# Patient Record
Sex: Female | Born: 1985 | Race: White | Hispanic: No | Marital: Married | Smoking: Never smoker
Health system: Southern US, Community
[De-identification: ages and names within clinical notes are randomized; demographics above are authoritative.]

## PROBLEM LIST (undated history)

## (undated) ENCOUNTER — Inpatient Hospital Stay (HOSPITAL_COMMUNITY): Payer: Self-pay

## (undated) DIAGNOSIS — K219 Gastro-esophageal reflux disease without esophagitis: Secondary | ICD-10-CM

## (undated) DIAGNOSIS — L409 Psoriasis, unspecified: Secondary | ICD-10-CM

## (undated) DIAGNOSIS — Z6740 Type O blood, Rh positive: Secondary | ICD-10-CM

## (undated) DIAGNOSIS — Z800 Family history of malignant neoplasm of digestive organs: Secondary | ICD-10-CM

## (undated) DIAGNOSIS — K589 Irritable bowel syndrome without diarrhea: Secondary | ICD-10-CM

## (undated) DIAGNOSIS — E559 Vitamin D deficiency, unspecified: Secondary | ICD-10-CM

## (undated) DIAGNOSIS — E66812 Obesity, class 2: Secondary | ICD-10-CM

## (undated) DIAGNOSIS — J45909 Unspecified asthma, uncomplicated: Secondary | ICD-10-CM

## (undated) DIAGNOSIS — M79606 Pain in leg, unspecified: Secondary | ICD-10-CM

## (undated) DIAGNOSIS — I10 Essential (primary) hypertension: Secondary | ICD-10-CM

## (undated) DIAGNOSIS — N390 Urinary tract infection, site not specified: Secondary | ICD-10-CM

## (undated) DIAGNOSIS — E669 Obesity, unspecified: Secondary | ICD-10-CM

## (undated) HISTORY — DX: Psoriasis, unspecified: L40.9

## (undated) HISTORY — DX: Type O blood, Rh positive: Z67.40

## (undated) HISTORY — DX: Gastro-esophageal reflux disease without esophagitis: K21.9

## (undated) HISTORY — PX: COLONOSCOPY: SHX174

## (undated) HISTORY — DX: Obesity, unspecified: E66.9

## (undated) HISTORY — PX: NO PAST SURGERIES: SHX2092

## (undated) HISTORY — DX: Essential (primary) hypertension: I10

## (undated) HISTORY — DX: Vitamin D deficiency, unspecified: E55.9

## (undated) HISTORY — PX: ESOPHAGOGASTRODUODENOSCOPY: SHX1529

## (undated) HISTORY — DX: Obesity, class 2: E66.812

## (undated) HISTORY — DX: Family history of malignant neoplasm of digestive organs: Z80.0

## (undated) HISTORY — DX: Irritable bowel syndrome without diarrhea: K58.9

## (undated) HISTORY — DX: Urinary tract infection, site not specified: N39.0

## (undated) HISTORY — DX: Pain in leg, unspecified: M79.606

---

## 2010-01-21 LAB — HM PAP SMEAR

## 2010-03-29 ENCOUNTER — Encounter: Payer: Self-pay | Admitting: Cardiology

## 2010-03-29 ENCOUNTER — Ambulatory Visit (HOSPITAL_COMMUNITY): Payer: BC Managed Care – PPO | Attending: Family Medicine

## 2010-03-29 DIAGNOSIS — R0989 Other specified symptoms and signs involving the circulatory and respiratory systems: Secondary | ICD-10-CM | POA: Insufficient documentation

## 2010-03-29 DIAGNOSIS — Z8249 Family history of ischemic heart disease and other diseases of the circulatory system: Secondary | ICD-10-CM | POA: Insufficient documentation

## 2010-03-29 DIAGNOSIS — R072 Precordial pain: Secondary | ICD-10-CM

## 2010-03-29 DIAGNOSIS — R0609 Other forms of dyspnea: Secondary | ICD-10-CM | POA: Insufficient documentation

## 2010-03-29 DIAGNOSIS — R002 Palpitations: Secondary | ICD-10-CM | POA: Insufficient documentation

## 2010-03-29 DIAGNOSIS — I079 Rheumatic tricuspid valve disease, unspecified: Secondary | ICD-10-CM | POA: Insufficient documentation

## 2010-03-30 ENCOUNTER — Encounter: Payer: Self-pay | Admitting: Cardiology

## 2010-04-23 LAB — FECAL OCCULT BLOOD, GUAIAC: Fecal Occult Blood: NEGATIVE

## 2010-05-26 ENCOUNTER — Encounter: Payer: Self-pay | Admitting: Family Medicine

## 2010-12-10 ENCOUNTER — Other Ambulatory Visit: Payer: Self-pay

## 2010-12-10 DIAGNOSIS — I872 Venous insufficiency (chronic) (peripheral): Secondary | ICD-10-CM

## 2010-12-23 ENCOUNTER — Encounter: Payer: Self-pay | Admitting: Surgery

## 2011-01-10 ENCOUNTER — Encounter: Payer: Self-pay | Admitting: Surgery

## 2011-01-10 ENCOUNTER — Ambulatory Visit (INDEPENDENT_AMBULATORY_CARE_PROVIDER_SITE_OTHER): Payer: BC Managed Care – PPO | Admitting: Surgery

## 2011-01-10 ENCOUNTER — Other Ambulatory Visit (INDEPENDENT_AMBULATORY_CARE_PROVIDER_SITE_OTHER): Payer: BC Managed Care – PPO | Admitting: *Deleted

## 2011-01-10 VITALS — BP 112/75 | HR 72 | Resp 14 | Ht 61.0 in | Wt 174.0 lb

## 2011-01-10 DIAGNOSIS — M79609 Pain in unspecified limb: Secondary | ICD-10-CM

## 2011-01-10 NOTE — Progress Notes (Signed)
Vascular and Vein Specialist of [facility]   Patient name: Leslie Beck MRN: [id number] DOB: 1985-02-24 Sex: female   Referred by: Rudi Heap, M.D.   Reason for referral:  Chief Complaint  Patient presents with  . New Evaluation    Pt c/o redness in BLE since September. Pt has Psorasis and has seen a dermatologist. She has worn compression hose, no help just irritated her skin more.    HISTORY OF PRESENT ILLNESS: This is a new patient consultation for evaluation of leg swelling and tingling. The patient first began having symptoms in July/August. While she was at the beach she developed swelling and pain in both of her legs. This was associated with tingling and numbness of her knee down to her feet. She took ibuprofen to help with his symptoms. They lasted for approximately 2 weeks. Since then she still has episodes of her legs turning her bright red with some amount of swelling as well as numbness. The aggravating by being in warm environment or with exercise. Cooling down helps her symptoms. She does not report that she has episodes of her feet turning blue or white. This process is not painful. She has been seen by a dermatologist who did not feel this was a skin condition. She does have a history of psoriasis on her feet. She has tried to wear compression stockings however this irritated her.     Past Medical History  Diagnosis Date  . ALLERGIC RHINITIS   . Leg pain   . Psoriasis     History reviewed. No pertinent past surgical history.  History   Social History  . Marital Status: Married    Spouse Name: N/A    Number of Children: N/A  . Years of Education: N/A   Occupational History  . Not on file.   Social History Main Topics  . Smoking status: Never Smoker   . Smokeless tobacco: Never Used  . Alcohol Use: No  . Drug Use: No  . Sexually Active: Yes    Birth Control/ Protection: Pill   Other Topics Concern  . Not on file   Social History Narrative  . No  narrative on file    Family History  Problem Relation Age of Onset  . Hypertension Mother   . Hypothyroidism Mother   . Other Mother     varicose veins  . Hypertension Father     Allergies as of 01/10/2011 - Review Complete 01/10/2011  Allergen Reaction Noted  . Zithromax (azithromycin) Rash 05/26/2010    Current Outpatient Prescriptions on File Prior to Visit  Medication Sig Dispense Refill  . Clobetasol Propionate (TEMOVATE) 0.05 % external spray Apply 1 spray topically 2 (two) times daily.        Lorita Officer Triphasic (TRI-SPRINTEC PO) Take 1 tablet by mouth daily.        Marland Kitchen omeprazole (PRILOSEC) 40 MG capsule Take 40 mg by mouth daily.           REVIEW OF SYSTEMS: Cardiovascular: No chest pain, chest pressure, palpitations, orthopnea, or dyspnea on exertion. No claudication or rest pain,  No history of DVT or phlebitis. Pulmonary: No productive cough, asthma or wheezing. Neurologic: No weakness, paresthesias, aphasia, or amaurosis. No dizziness. Hematologic: No bleeding problems or clotting disorders. Musculoskeletal: No joint pain or joint swelling. Gastrointestinal: No blood in stool or hematemesis Genitourinary: No dysuria or hematuria. Psychiatric:: No history of major depression. Integumentary: No rashes or ulcers. Constitutional: No fever or chills.  PHYSICAL EXAMINATION: General:  The patient appears their stated age.  Vital signs are BP 112/75  Pulse 72  Resp 14  Ht 5\' 1"  (1.549 m)  Wt 174 lb (78.926 kg)  BMI 32.88 kg/m2  SpO2 100% HEENT:  No gross abnormalities Pulmonary: Respirations are non-labored Musculoskeletal: There are no major deformities.   Neurologic: No focal weakness or paresthesias are detected, Skin: There are no ulcer or rashes noted. Some red areas on her calf is soft. There is a purplish streaking artifact on the left foot which is very faint Psychiatric: The patient has normal affect. Cardiovascular: Palpable dorsalis pedis  pulse bilaterally  Diagnostic Studies: Venous duplex was performed today this shows no evidence of greater saphenous or small saphenous reflux. There is reflux in both common femoral veins.   Medication Changes: None  Assessment:  Leg swelling and numbness/tingling Plan: Her duplex ultrasound shows no evidence of superficial venous reflux. There is reflux in the common femoral vein however this has not transmitted distally. In addition she has palpable pulses. With these findings I reiterated to the patient that I do not feel that her symptoms are of a vascular etiology. This sounds to me to be more neuropathic in origin. This does not sound like a classic Raynaud's phenomenon however with her history of psoriasis I certainly feel that this could be a autoimmune type process. She may benefit from a rheumatologic consult. Again I do not think that there is a vascular component to her disease process     V. Charlena Cross, M.D. [facility] [place] Office: [phone] Pager:  [phone]

## 2011-01-11 ENCOUNTER — Encounter: Payer: Self-pay | Admitting: Family Medicine

## 2011-01-31 NOTE — Procedures (Unsigned)
LOWER EXTREMITY VENOUS REFLUX EXAM  INDICATION:  Tingling in legs and purplish reddish color changes when hot  EXAM:  Using color-flow imaging and pulse Doppler spectral analysis, the right and left common femoral, femoral, popliteal, posterior tibial, great and small saphenous veins were evaluated.  There is evidence suggesting deep venous insufficiency in the right and left lower extremity at the level of the common femoral vein.  The right and left saphenofemoral junction is competent.  The right and left GSV is competent.   The right and left proximal small saphenous vein demonstrates competency.  GSV Diameter (used if found to be incompetent only)                                           Right    Left Proximal Greater Saphenous Vein           cm       cm Proximal-to-mid-thigh                     cm       cm Mid thigh                                 cm       cm Mid-distal thigh                          cm       cm Distal thigh                              cm       cm Knee                                      cm       cm  IMPRESSION: 1. The right and left great saphenous vein is competent. 2. The right and left great saphenous vein is not tortuous. 3. The deep venous system is not competent with reflux of >500     milliseconds at the level of the common femoral vein. 4. The right and left small saphenous vein is competent.  ___________________________________________ V. Charlena Cross, MD  EM/MEDQ  D:  01/11/2011  T:  01/11/2011  Job:  [id number]

## 2011-07-28 ENCOUNTER — Ambulatory Visit (INDEPENDENT_AMBULATORY_CARE_PROVIDER_SITE_OTHER): Payer: BC Managed Care – PPO | Admitting: Family Medicine

## 2011-07-28 ENCOUNTER — Ambulatory Visit (HOSPITAL_BASED_OUTPATIENT_CLINIC_OR_DEPARTMENT_OTHER)
Admission: RE | Admit: 2011-07-28 | Discharge: 2011-07-28 | Disposition: A | Discharge status: Home / Self Care | Payer: BC Managed Care – PPO | Source: Ambulatory Visit | Attending: Family Medicine | Admitting: Family Medicine

## 2011-07-28 ENCOUNTER — Encounter: Payer: Self-pay | Admitting: Family Medicine

## 2011-07-28 VITALS — BP 127/89 | HR 90 | Temp 98.4°F | Ht 62.0 in | Wt 175.0 lb

## 2011-07-28 DIAGNOSIS — R599 Enlarged lymph nodes, unspecified: Secondary | ICD-10-CM | POA: Insufficient documentation

## 2011-07-28 DIAGNOSIS — R590 Localized enlarged lymph nodes: Secondary | ICD-10-CM

## 2011-07-28 DIAGNOSIS — L409 Psoriasis, unspecified: Secondary | ICD-10-CM

## 2011-07-28 DIAGNOSIS — Z0000 Encounter for general adult medical examination without abnormal findings: Secondary | ICD-10-CM

## 2011-07-28 DIAGNOSIS — L408 Other psoriasis: Secondary | ICD-10-CM

## 2011-07-28 LAB — CBC WITH DIFFERENTIAL/PLATELET
Basophils Relative: 0.4 % (ref 0.0–3.0)
Eosinophils Relative: 1.6 % (ref 0.0–5.0)
HCT: 45.4 % (ref 36.0–46.0)
Hemoglobin: 15.3 g/dL — ABNORMAL HIGH (ref 12.0–15.0)
Lymphocytes Relative: 30.5 % (ref 12.0–46.0)
Lymphs Abs: 3.3 10*3/uL (ref 0.7–4.0)
Monocytes Relative: 5.5 % (ref 3.0–12.0)
Neutro Abs: 6.6 10*3/uL (ref 1.4–7.7)
RBC: 4.89 Mil/uL (ref 3.87–5.11)
WBC: 10.7 10*3/uL — ABNORMAL HIGH (ref 4.5–10.5)

## 2011-07-28 LAB — COMPREHENSIVE METABOLIC PANEL
AST: 19 U/L (ref 0–37)
Albumin: 4.2 g/dL (ref 3.5–5.2)
Alkaline Phosphatase: 79 U/L (ref 39–117)
Glucose, Bld: 76 mg/dL (ref 70–99)
Potassium: 4.2 mEq/L (ref 3.5–5.1)
Sodium: 142 mEq/L (ref 135–145)
Total Bilirubin: 1.1 mg/dL (ref 0.3–1.2)
Total Protein: 7.4 g/dL (ref 6.0–8.3)

## 2011-07-28 LAB — LIPID PANEL
HDL: 75.2 mg/dL (ref >39.00)
LDL Cholesterol: 94 mg/dL (ref 0–99)
VLDL: 19.4 mg/dL (ref 0.0–40.0)

## 2011-07-28 NOTE — Assessment & Plan Note (Signed)
All topical steroids and dovonex are category C and she is not interested in trying a lower potency topical steroid at this time. Referral to [facility] ordered today.

## 2011-07-28 NOTE — Assessment & Plan Note (Signed)
Question of isolated pathologic lymph node--have ordered ultrasound of neck to be done and she is going to [facility] from the office today to get this done. CBC checked today.

## 2011-07-28 NOTE — Progress Notes (Signed)
Office Note 07/28/2011  CC:  Chief Complaint  Patient presents with  . Establish Care    ? ringworm, right foot; hx of psoriasis    HPI:  Leslie Beck is a 26 y.o. White female who is here to establish care and for rash on foot. Patient's most recent primary MD: Dr. Rudi Heap at [facility]  Dr. Henderson Cloud is her OB/GYN. Old records not reviewed prior to or during today's visit.  Has psoriasis on hands and feet, is trying to get pregnant and cannot use the clobetasol that she usually uses due to category C rating in pregnancy.  She sees a Armed forces operational officer at [facility] but says that ever since her initial dermatologist there moved she has felt like she isn't listened to and she is interested in referral to different dermatologist, particularly to discuss alternative therapies while pregnant (UV-B light therapy). 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  The rash areas itch and she has not been able to apply anything except nonmedicated emollients to them.  She asks for screening test for diabetes and cholesterol b/c of her weight.  She is fasting.  Past Medical History  Diagnosis Date  . ALLERGIC RHINITIS   . Leg pain     Bilat.  No DVT  . Psoriasis   . GERD (gastroesophageal reflux disease)     not on daily PPI anymore    History reviewed. No pertinent past surgical history.  Family History  Problem Relation Age of Onset  . Hypertension Mother   . Hypothyroidism Mother   . Other Mother     varicose veins  . Hypertension Father   . Cancer Maternal Grandfather     colon cancer  . Cancer Paternal Grandmother     colon cancer    History   Social History  . Marital Status: Married    Spouse Name: N/A    Number of Children: N/A  . Years of Education: N/A   Occupational History  . Not on file.   Social History Main Topics  .  Smoking status: Never Smoker   . Smokeless tobacco: Never Used  . Alcohol Use: No  . Drug Use: No  . Sexually Active: Yes   Other Topics Concern  . 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 Henderson Cloud for GYN.    Outpatient Encounter Prescriptions as of 07/28/2011  Medication Sig Dispense Refill  . clobetasol cream (TEMOVATE) 0.05 % as needed.       Marland Kitchen OVER THE COUNTER MEDICATION FOLATE 800 MG.  DAILY      . vitamin E (VITAMIN E) 400 UNIT capsule Take 800 Units by mouth daily.      Marland Kitchen DISCONTD: Clobetasol Propionate (TEMOVATE) 0.05 % external spray Apply 1 spray topically 2 (two) times daily.        Marland Kitchen DISCONTD: Lorita Officer Triphasic (TRI-SPRINTEC PO) Take 1 tablet by mouth daily.        Marland Kitchen DISCONTD: omeprazole (PRILOSEC) 40 MG capsule Take 40 mg by mouth daily.          Allergies  Allergen Reactions  . Zithromax (Azithromycin) Rash    ROS Review of Systems  Constitutional: Negative for fever and fatigue.  HENT: Negative for congestion and sore throat.  Eyes: Negative for visual disturbance.  Respiratory: Negative for cough.   Cardiovascular: Negative for chest pain.  Gastrointestinal: Negative for nausea and abdominal pain.  Genitourinary: Negative for dysuria.  Musculoskeletal: Negative for back pain and joint swelling.  Skin: Positive for rash.  Neurological: Negative for weakness and headaches.  Hematological: Negative for adenopathy.    PE; Blood pressure 127/89, pulse 90, temperature 98.4 F (36.9 C), temperature source Temporal, height 5\' 2"  (1.575 m), weight 175 lb (79.379 kg), last menstrual period 07/13/2011, SpO2 98.00%. Gen: Alert, well appearing.  Patient is oriented to person, place, time, and situation. ENT: Ears: EACs clear, normal epithelium.  TMs with good light reflex and landmarks bilaterally.  Eyes: no injection, icteris, swelling, or  exudate.  EOMI, PERRLA. Nose: no drainage or turbinate edema/swelling.  No injection or focal lesion.  Mouth: lips without lesion/swelling.  Oral mucosa pink and moist.  Dentition intact and without obvious caries or gingival swelling.  Oropharynx without erythema, exudate, or swelling.  NECK: left submandibular region has a firm lymph node palpable--feels isolated, is moveable, is tender with firm palpation, feels approx 2 cm size.  No overlying skin changes.  No thyromegaly. CV: RRR, no m/r/g.   LUNGS: CTA bilat, nonlabored resps, good aeration in all lung fields. ABD: soft, NT, ND, BS normal.  No hepatospenomegaly or mass.  No bruits. EXT: no clubbing, cyanosis, or edema.  SKIN: deep pink patches of hyperkeratotic papular rash on medial surfaces of both feet, a few small scattered patches on each hand.  On right foot the posterior-most area of her rash has a vague circular shape with a hint of central sparing.  No flaking noted on this area, minimal flaking on other areas of the rash.  No vesicles or excoriations.     Pertinent labs:  None today  ASSESSMENT AND PLAN:   Psoriasis All topical steroids and dovonex are category C and she is not interested in trying a lower potency topical steroid at this time. Referral to [facility] ordered today.  Lymphadenopathy, submandibular Question of isolated pathologic lymph node--have ordered ultrasound of neck to be done and she is going to [facility] from the office today to get this done. CBC checked today.  Preventative health care Check CMET and fasting lipid panel today.  F/u to be determined based on results of labs and ultrasound.  Return if symptoms worsen or fail to improve.

## 2011-07-28 NOTE — Assessment & Plan Note (Signed)
Check CMET and fasting lipid panel today.

## 2011-07-28 NOTE — Addendum Note (Signed)
Addended by: Jeoffrey Massed on: 07/28/2011 01:25 PM   Modules accepted: Orders

## 2011-08-01 ENCOUNTER — Other Ambulatory Visit: Payer: Self-pay | Admitting: Radiology

## 2011-08-02 ENCOUNTER — Encounter (HOSPITAL_COMMUNITY): Payer: Self-pay | Admitting: Pharmacy Technician

## 2011-08-02 ENCOUNTER — Other Ambulatory Visit: Payer: Self-pay | Admitting: Radiology

## 2011-08-03 ENCOUNTER — Other Ambulatory Visit: Payer: Self-pay | Admitting: Family Medicine

## 2011-08-03 ENCOUNTER — Encounter (HOSPITAL_COMMUNITY): Payer: Self-pay

## 2011-08-03 ENCOUNTER — Ambulatory Visit (HOSPITAL_COMMUNITY)
Admission: RE | Admit: 2011-08-03 | Discharge: 2011-08-03 | Disposition: A | Discharge status: Home / Self Care | Payer: BC Managed Care – PPO | Source: Ambulatory Visit | Attending: Family Medicine | Admitting: Family Medicine

## 2011-08-03 DIAGNOSIS — Z538 Procedure and treatment not carried out for other reasons: Secondary | ICD-10-CM | POA: Insufficient documentation

## 2011-08-03 DIAGNOSIS — R590 Localized enlarged lymph nodes: Secondary | ICD-10-CM

## 2011-08-03 DIAGNOSIS — R599 Enlarged lymph nodes, unspecified: Secondary | ICD-10-CM | POA: Insufficient documentation

## 2011-08-03 LAB — CBC
MCHC: 35.6 g/dL (ref 30.0–36.0)
MCV: 87.1 fL (ref 78.0–100.0)
Platelets: 250 10*3/uL (ref 150–400)
RDW: 12 % (ref 11.5–15.5)
WBC: 9.5 10*3/uL (ref 4.0–10.5)

## 2011-08-03 LAB — APTT: aPTT: 29 seconds (ref 24–37)

## 2011-08-03 MED ORDER — SODIUM CHLORIDE 0.9 % IV SOLN
Freq: Once | INTRAVENOUS | Status: AC
  Administered 2011-08-03: 13:00:00 via INTRAVENOUS

## 2011-08-03 NOTE — Progress Notes (Signed)
Patient ID: Leslie Beck, female   DOB: Jun 29, 1985, 26 y.o.   MRN: [id number]  Ultrasound shows decrease in size of left neck node, now measuring 9 mm.  No indication for biopsy.  No additional enlarged nodes identified in neck by ultrasound.  Will dictate imaging report.

## 2011-08-03 NOTE — H&P (Signed)
For US guided LN biopsy.

## 2011-08-03 NOTE — H&P (Signed)
Chief Complaint: "I'm here for lymph node biopsy" Physician:McGowen HPI: Leslie Beck is an 26 y.o. female who was found to have an enlarged LN in the left side of her neck of physical exam. A subsequent US showed a 1.6 x 1.0 cm enlarged LN. She has been referred for US guided biopsy. She denies associated illnesses, fever, wt change. Only recent infection was yeast infection. Since it was found on PE and [place], she feels it is smaller.   Past Medical History:  Past Medical History  Diagnosis Date  . ALLERGIC RHINITIS   . Leg pain     Bilat.  No DVT  . Psoriasis   . GERD (gastroesophageal reflux disease)     not on daily PPI anymore    Past Surgical History: No past surgical history on file.  Family History:  Family History  Problem Relation Age of Onset  . Hypertension Mother   . Hypothyroidism Mother   . Other Mother     varicose veins  . Hypertension Father   . Cancer Maternal Grandfather     colon cancer  . Cancer Paternal Grandmother     colon cancer    Social History:  reports that she has never smoked. She has never used smokeless tobacco. She reports that she does not drink alcohol or use illicit drugs.  Allergies:  Allergies  Allergen Reactions  . Zithromax (Azithromycin) Rash    Medications: (Not in a hospital admission)  Please HPI for pertinent positives, otherwise complete 10 system ROS negative.  Physical Exam: Blood pressure 131/83, pulse 101, temperature 97.1 F (36.2 C), temperature source Oral, resp. rate 18, height 5\' 2"  (1.575 m), weight 170 lb (77.111 kg), last menstrual period 07/13/2011, SpO2 100.00%. Body mass index is 31.09 kg/(m^2).   General Appearance:  Alert, cooperative, no distress, appears stated age  Head:  Normocephalic, without obvious abnormality, atraumatic  ENT: Unremarkable  Neck: Supple, symmetrical, trachea midline, possible very subtle (L)cervial LN, NT to palp  Lungs:   Clear to auscultation bilaterally, no w/r/r,  respirations unlabored without use of accessory muscles.  Chest Wall:  No tenderness or deformity  Heart:  Regular rate and rhythm, S1, S2 normal, no murmur, rub or gallop. Carotids 2+ without bruit.  Abdomen:   Soft, non-tender, non distended. Bowel sounds active all four quadrants,  no masses, no organomegaly.  Neurologic: Normal affect, no gross deficits.   No results found for this or any previous visit (from the past 48 hour(s)). No results found.  Assessment/Plan (L)neck lymphadenopathy Schedule for US guided biopsy. Discussed plan to look with [place] and if Lymphadenopathy resolved, may not do biopsy. Otherwise discussed risks and complications. Labs pending Consent signed in chart.  Brayton El PA-C 08/03/2011, 1:23 PM

## 2011-08-15 ENCOUNTER — Encounter: Payer: Self-pay | Admitting: Family Medicine

## 2011-08-26 ENCOUNTER — Ambulatory Visit (INDEPENDENT_AMBULATORY_CARE_PROVIDER_SITE_OTHER): Payer: BC Managed Care – PPO | Admitting: Family Medicine

## 2011-08-26 ENCOUNTER — Encounter: Payer: Self-pay | Admitting: Family Medicine

## 2011-08-26 ENCOUNTER — Other Ambulatory Visit (HOSPITAL_COMMUNITY)
Admission: RE | Admit: 2011-08-26 | Discharge: 2011-08-26 | Disposition: A | Discharge status: Home / Self Care | Payer: BC Managed Care – PPO | Source: Ambulatory Visit | Attending: Family Medicine | Admitting: Family Medicine

## 2011-08-26 VITALS — BP 129/87 | HR 105 | Temp 98.1°F | Ht 62.0 in | Wt 179.0 lb

## 2011-08-26 DIAGNOSIS — R102 Pelvic and perineal pain: Secondary | ICD-10-CM

## 2011-08-26 DIAGNOSIS — N949 Unspecified condition associated with female genital organs and menstrual cycle: Secondary | ICD-10-CM

## 2011-08-26 DIAGNOSIS — N760 Acute vaginitis: Secondary | ICD-10-CM

## 2011-08-26 DIAGNOSIS — Z01419 Encounter for gynecological examination (general) (routine) without abnormal findings: Secondary | ICD-10-CM | POA: Insufficient documentation

## 2011-08-26 DIAGNOSIS — N898 Other specified noninflammatory disorders of vagina: Secondary | ICD-10-CM

## 2011-08-26 LAB — POCT URINE PREGNANCY: Preg Test, Ur: NEGATIVE

## 2011-08-26 MED ORDER — FLUCONAZOLE 200 MG PO TABS: ORAL_TABLET | ORAL | Status: DC

## 2011-08-26 MED ORDER — METRONIDAZOLE 500 MG PO TABS: ORAL_TABLET | ORAL | Status: DC

## 2011-08-26 NOTE — Patient Instructions (Addendum)
Bacterial Vaginosis Bacterial vaginosis (BV) is a vaginal infection where the normal balance of bacteria in the vagina is disrupted. The normal balance is then replaced by an overgrowth of certain bacteria. There are several different kinds of bacteria that can cause BV. BV is the most common vaginal infection in women of childbearing age. CAUSES   The cause of BV is not fully understood. BV develops when there is an increase or imbalance of harmful bacteria.   Some activities or behaviors can upset the normal balance of bacteria in the vagina and put women at increased risk including:   Having a new sex partner or multiple sex partners.   Douching.   Using an intrauterine device (IUD) for contraception.   It is not clear what role sexual activity plays in the development of BV. However, women that have never had sexual intercourse are rarely infected with BV.  Women do not get BV from toilet seats, bedding, swimming pools or from touching objects around them.  SYMPTOMS   Grey vaginal discharge.   A fish-like odor with discharge, especially after sexual intercourse.   Itching or burning of the vagina and vulva.   Burning or pain with urination.   Some women have no signs or symptoms at all.  DIAGNOSIS  Your caregiver must examine the vagina for signs of BV. Your caregiver will perform lab tests and look at the sample of vaginal fluid through a microscope. They will look for bacteria and abnormal cells (clue cells), a pH test higher than 4.5, and a positive amine test all associated with BV.  RISKS AND COMPLICATIONS   Pelvic inflammatory disease (PID).   Infections following gynecology surgery.   Developing HIV.   Developing herpes virus.  TREATMENT  Sometimes BV will clear up without treatment. However, all women with symptoms of BV should be treated to avoid complications, especially if gynecology surgery is planned. Female partners generally do not need to be treated. However,  BV may spread between female sex partners so treatment is helpful in preventing a recurrence of BV.   BV may be treated with antibiotics. The antibiotics come in either pill or vaginal cream forms. Either can be used with nonpregnant or pregnant women, but the recommended dosages differ. These antibiotics are not harmful to the baby.   BV can recur after treatment. If this happens, a second round of antibiotics will often be prescribed.   Treatment is important for pregnant women. If not treated, BV can cause a premature delivery, especially for a pregnant woman who had a premature birth in the past. All pregnant women who have symptoms of BV should be checked and treated.   For chronic reoccurrence of BV, treatment with a type of prescribed gel vaginally twice a week is helpful.  HOME CARE INSTRUCTIONS   Finish all medication as directed by your caregiver.   Do not have sex until treatment is completed.   Tell your sexual partner that you have a vaginal infection. They should see their caregiver and be treated if they have problems, such as a mild rash or itching.   Practice safe sex. Use condoms. Only have 1 sex partner.  PREVENTION  Basic prevention steps can help reduce the risk of upsetting the natural balance of bacteria in the vagina and developing BV:  Do not have sexual intercourse (be abstinent).   Do not douche.   Use all of the medicine prescribed for treatment of BV, even if the signs and symptoms go away.     Tell your sex partner if you have BV. That way, they can be treated, if needed, to prevent reoccurrence.  SEEK MEDICAL CARE IF:   Your symptoms are not improving after 3 days of treatment.   You have increased discharge, pain, or fever.  MAKE SURE YOU:   Understand these instructions.   Will watch your condition.   Will get help right away if you are not doing well or get worse.  FOR MORE INFORMATION  Division of STD Prevention (DSTDP), Centers for Disease  Control and Prevention: www.cdc.gov/std American Social Health Association (ASHA): www.ashastd.org  Document Released: 01/31/2005 Document Revised: 01/20/2011 Document Reviewed: 07/24/2008 ExitCare Patient Information 2012 [employer]. 

## 2011-08-26 NOTE — Progress Notes (Signed)
OFFICE NOTE  08/26/2011  CC:  Chief Complaint  Patient presents with  . Acute    vaginal discharge, burning; LMP 6/29; trying to conceive     HPI: Patient is a 26 y.o. Caucasian female who is here for vaginal discharge. She describes recurrent itchy, burning vaginal region over the last 2 mo, discharge present (white, ?blood tinged today).  Has been treated repeatedly with either single dose diflucan or multidose monistat and each time her symptoms resolved for short periods and then returned.  No abx prior to onset of these sx's. Has apparently had one wet prep that was unremarkable but she was treated for yeast anyway. She is married and she is trying to get pregnant, but her frequency of intercourse has actually been almost none over the last couple of months due to her symptoms.  LMP 08/13/11. Denies urinary urgency, burning, or frequency.  Pertinent PMH:  Past Medical History  Diagnosis Date  . ALLERGIC RHINITIS   . Leg pain     Bilat.  No DVT  . Psoriasis   . GERD (gastroesophageal reflux disease)     not on daily PPI anymore  . 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 clobetasol cream (TEMOVATE) 0.05 % Apply 1 application topically as needed.       Marland Kitchen OVER THE COUNTER MEDICATION Take 1 tablet by mouth daily. FOLATE 800 MG.      . vitamin E (VITAMIN E) 400 UNIT capsule Take 800 Units by mouth daily.        PE: Blood pressure 129/87, pulse 105, temperature 98.1 F (36.7 C), temperature source Temporal, height 5\' 2"  (1.575 m), weight 179 lb (81.194 kg), last menstrual period 08/13/2011. Gen: Alert, well appearing.  Patient is oriented to person, place, time, and situation. Pelvic exam: deep pink rash symmetrically distributed around her labia majora, quite a bit of milky discharge. No blood.  Vaginal mucosa normal.  Cervix without focal lesion, os is closed.  Bimanual: no cervical motion tenderness.  LAB:  UPT today negative  IMPRESSION AND PLAN:  Vulvovaginitis Sx's and exam more suggestive of BV, but she does have a yeast-like rash on her labia majora. I did a wet prep and sent to lab, also sent GC/Chlamydia probe. Gave empiric metronidazole 500 mg bid x 7d as well as diflucan 200mg  qd x 5d. Gave educational handout on BV and reviewed it with her.     FOLLOW UP: prn

## 2011-08-28 NOTE — Assessment & Plan Note (Signed)
Sx's and exam more suggestive of BV, but she does have a yeast-like rash on her labia majora. I did a wet prep and sent to lab, also sent GC/Chlamydia probe. Gave empiric metronidazole 500 mg bid x 7d as well as diflucan 200mg  qd x 5d. Gave educational handout on BV and reviewed it with her.

## 2011-08-31 ENCOUNTER — Telehealth: Payer: Self-pay | Admitting: *Deleted

## 2011-08-31 NOTE — Telephone Encounter (Signed)
PC from patient to see if lab results are back from Friday.  Advised all results are not back, but results that have finalized are negative/normal.  She is agreeable.  She states her symptoms are better, but she is still having brown./bloody discharge.  She is finished with diflucan, and continues to take flagyl.  Advised we will call her when we have more results.

## 2011-08-31 NOTE — Telephone Encounter (Signed)
OK.  I agree.

## 2011-09-27 ENCOUNTER — Other Ambulatory Visit: Payer: Self-pay | Admitting: Obstetrics & Gynecology

## 2011-11-10 ENCOUNTER — Ambulatory Visit: Payer: BC Managed Care – PPO | Admitting: Family Medicine

## 2011-11-21 ENCOUNTER — Encounter: Payer: Self-pay | Admitting: Family Medicine

## 2011-11-21 ENCOUNTER — Ambulatory Visit (INDEPENDENT_AMBULATORY_CARE_PROVIDER_SITE_OTHER): Payer: BC Managed Care – PPO | Admitting: Family Medicine

## 2011-11-21 VITALS — BP 131/84 | HR 103 | Ht 62.0 in | Wt 180.0 lb

## 2011-11-21 DIAGNOSIS — Z3490 Encounter for supervision of normal pregnancy, unspecified, unspecified trimester: Secondary | ICD-10-CM | POA: Insufficient documentation

## 2011-11-21 DIAGNOSIS — R3915 Urgency of urination: Secondary | ICD-10-CM

## 2011-11-21 DIAGNOSIS — R350 Frequency of micturition: Secondary | ICD-10-CM

## 2011-11-21 DIAGNOSIS — N912 Amenorrhea, unspecified: Secondary | ICD-10-CM

## 2011-11-21 DIAGNOSIS — Z331 Pregnant state, incidental: Secondary | ICD-10-CM

## 2011-11-21 LAB — POCT URINALYSIS DIPSTICK
Bilirubin, UA: NEGATIVE
Ketones, UA: NEGATIVE
Protein, UA: NEGATIVE
Spec Grav, UA: 1.01

## 2011-11-21 LAB — POCT URINE PREGNANCY: Preg Test, Ur: POSITIVE

## 2011-11-21 NOTE — Assessment & Plan Note (Signed)
First trimester. Some mild cramping but no vag bleeding or d/c. Her urine looks clear but will send for c/s for completeness. Follow expectantly, start PNV, avoid NSAIDs.  She'll arrange initial appt with her OB/GYN in a couple of months or so, earlier if problems or questions. She'll return soon for flu vaccine IM.

## 2011-11-21 NOTE — Progress Notes (Signed)
OFFICE NOTE  11/21/2011  CC:  Chief Complaint  Patient presents with  . Amenorrhea    LMP 10/16/11, + UPT at home, - UPT 2 days prior     HPI: Patient is a 26 y.o. Caucasian female who is here for suspected pregnancy. Her LMP was 10/16/11.  She did a home UPT 2 days ago and it was positive. She complains of mild fatigue that is new but no nausea. She also started having mild (2/10 intensity) suprapubic cramps described as "annoying twinges".  No vag bleeding.  No vag d/c.  No dysuria.  Bowels moving fine. Menses are usually very regular. This is her first known pregnancy.   Pertinent PMH:  Past Medical History  Diagnosis Date  . ALLERGIC RHINITIS   . Leg pain     Bilat.  No DVT  . Psoriasis   . GERD (gastroesophageal reflux disease)     not on daily PPI anymore  . 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  PE: Blood pressure 131/84, pulse 103, height 5\' 2"  (1.575 m), weight 180 lb (81.647 kg), last menstrual period 10/16/2011. Gen: Alert, well appearing.  Patient is oriented to person, place, time, and situation. ENT:  Eyes: no injection, icteris, swelling, or exudate.  EOMI, PERRLA. Nose: no drainage or turbinate edema/swelling.  No injection or focal lesion.  Mouth: lips without lesion/swelling.  Oral mucosa pink and moist.  Dentition intact and without obvious caries or gingival swelling.  Oropharynx without erythema, exudate, or swelling.  Neck - No masses or thyromegaly or limitation in range of motion CV: RRR, no m/r/g.   LUNGS: CTA bilat, nonlabored resps, good aeration in all lung fields. ABD: soft, NT, ND, BS normal.  No hepatospenomegaly or mass.  No bruits. EXT: no clubbing, cyanosis, or edema.   LAB: UPT here in office today was positive.  IMPRESSION AND PLAN:  Pregnancy First trimester. Some mild cramping but no vag bleeding or d/c. Her urine looks clear but will send for c/s for completeness. Follow expectantly,  start PNV, avoid NSAIDs.  She'll arrange initial appt with her OB/GYN in a couple of months or so, earlier if problems or questions. She'll return soon for flu vaccine IM.   An After Visit Summary was printed and given to the patient.  FOLLOW UP: prn

## 2011-11-23 LAB — URINE CULTURE: Colony Count: 7000

## 2011-12-30 ENCOUNTER — Inpatient Hospital Stay (HOSPITAL_COMMUNITY)
Admission: AD | Admit: 2011-12-30 | Discharge: 2011-12-30 | Disposition: A | Discharge status: Home / Self Care | Payer: BC Managed Care – PPO | Source: Ambulatory Visit | Attending: Obstetrics and Gynecology | Admitting: Obstetrics and Gynecology

## 2011-12-30 ENCOUNTER — Encounter (HOSPITAL_COMMUNITY): Payer: Self-pay

## 2011-12-30 ENCOUNTER — Inpatient Hospital Stay (HOSPITAL_COMMUNITY): Payer: BC Managed Care – PPO

## 2011-12-30 DIAGNOSIS — O418X90 Other specified disorders of amniotic fluid and membranes, unspecified trimester, not applicable or unspecified: Secondary | ICD-10-CM

## 2011-12-30 DIAGNOSIS — O4690 Antepartum hemorrhage, unspecified, unspecified trimester: Secondary | ICD-10-CM

## 2011-12-30 DIAGNOSIS — O209 Hemorrhage in early pregnancy, unspecified: Secondary | ICD-10-CM | POA: Insufficient documentation

## 2011-12-30 DIAGNOSIS — O468X9 Other antepartum hemorrhage, unspecified trimester: Secondary | ICD-10-CM | POA: Diagnosis present

## 2011-12-30 HISTORY — DX: Unspecified asthma, uncomplicated: J45.909

## 2011-12-30 LAB — CBC
MCH: 31 pg (ref 26.0–34.0)
MCV: 89 fL (ref 78.0–100.0)
Platelets: 234 10*3/uL (ref 150–400)
RBC: 4.26 MIL/uL (ref 3.87–5.11)

## 2011-12-30 NOTE — MAU Note (Signed)
Patient was sent by [facility] for further evaluation, around 3 a.m yesterday had intermittent bleeding then turned brown, around 10:30 a.m. Brown progressing to bright red,  Has changed 1 pad, cramping on right side, had [place] yesterday was fine at [facility]

## 2011-12-30 NOTE — MAU Provider Note (Signed)
Chief Complaint: Vaginal Bleeding   First Provider Initiated Contact with Patient 12/30/11 1605     SUBJECTIVE HPI: Leslie Beck is a 26 y.o. G1P0 at [redacted]w[redacted]d by LMP who presents to maternity admissions reporting heavy vaginal bleeding today.  She was seen at [facility] yesterday with vaginal spotting and had a normal U/S.  Today, her bleeding increased to soaking 1/2 pad/hour for 2 hours but is starting to decrease now to spotting.  She reports some mild right sided pain intermittently.  She  Denies vaginal itching/burning, urinary symptoms, h/a, dizziness, n/v, or fever/chills.     Past Medical History  Diagnosis Date  . ALLERGIC RHINITIS   . Leg pain     Bilat.  No DVT  . Psoriasis   . GERD (gastroesophageal reflux disease)     not on daily PPI anymore  . Recurrent UTI 2012/13  . Asthma    Past Surgical History  Procedure Date  . No past surgeries    History   Social History  . Marital Status: Married    Spouse Name: N/A    Number of Children: N/A  . Years of Education: N/A   Occupational History  . Not on file.   Social History Main Topics  . Smoking status: Never Smoker   . Smokeless tobacco: Never Used  . Alcohol Use: No  . Drug Use: No  . Sexually Active: Yes -- Female partner(s)    Birth Control/ Protection: None   Other Topics Concern  . 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 Henderson Cloud for GYN.   No current facility-administered medications on file prior to encounter.   No current outpatient prescriptions on file prior to encounter.   Allergies  Allergen Reactions  . Zithromax (Azithromycin) Rash    ROS: Pertinent items in HPI  OBJECTIVE Blood pressure 136/84, pulse 106, temperature 98.2 F (36.8 C), temperature source Oral, resp. rate 16, height 5\' 1"  (1.549 m), weight 82.464 kg (181 lb 12.8 oz), last menstrual period  10/16/2011. GENERAL: Well-developed, well-nourished female in no acute distress.  HEENT: Normocephalic HEART: normal rate RESP: normal effort ABDOMEN: Soft, non-tender EXTREMITIES: Nontender, no edema NEURO: Alert and oriented Pelvic exam: Cervix pink, visually closed, without lesion, moderate amount dark brown blood in vagina, vaginal walls and external genitalia normal Bimanual exam: Cervix 0/long/high, firm, anterior, neg CMT, uterus nontender, ~10 week size, adnexa without tenderness, enlargement, or mass  LAB RESULTS Results for orders placed during the hospital encounter of 12/30/11 (from the past 24 hour(s))  CBC     Status: Abnormal   Collection Time   12/30/11  4:34 PM      Component Value Range   WBC 13.8 (*) 4.0 - 10.5 K/uL   RBC 4.26  3.87 - 5.11 MIL/uL   Hemoglobin 13.2  12.0 - 15.0 g/dL   HCT 16.1  09.6 - 04.5 %   MCV 89.0  78.0 - 100.0 fL   MCH 31.0  26.0 - 34.0 pg   MCHC 34.8  30.0 - 36.0 g/dL   RDW 40.9  81.1 - 91.4 %   Platelets 234  150 - 400 K/uL  ABO/RH     Status: Normal (Preliminary result)   Collection Time   12/30/11  4:34 PM      Component Value Range   ABO/RH(D) O POS      IMAGING US Ob Comp Less  14 Wks  12/30/2011  *RADIOLOGY REPORT*  Clinical Data: Pregnant, vaginal bleeding, right-sided pain  OBSTETRIC <14 WK [place] AND TRANSVAGINAL OB US  Technique:  Both transabdominal and transvaginal ultrasound examinations were performed for complete evaluation of the gestation as well as the maternal uterus, adnexal regions, and pelvic cul-de-sac.  Transvaginal technique was performed to assess early pregnancy.  Comparison:  None.  Intrauterine gestational sac:  Visualized/normal in shape. Yolk sac: Not visualized. Embryo: Present Cardiac Activity: Present Heart Rate: 162 bpm  CRL: 33.5  mm  10 w  2 d            [place] EDC: 07/25/2012  Maternal uterus/adnexae: Small subchorionic hemorrhage.  Right ovary is within normal limits, measuring 3.4 x 2.9 x 3.0 cm, and is  notable for a corpus luteal cyst.  Left ovary is within normal limits, measuring 1.5 x 1.9 x 0.9 cm.  No free fluid.  IMPRESSION: Single live intrauterine gestation with estimated gestational age [redacted] weeks 2 days by crown-rump length.   Original Report Authenticated By: Charline Bills, M.D.    US Ob Transvaginal  12/30/2011  *RADIOLOGY REPORT*  Clinical Data: Pregnant, vaginal bleeding, right-sided pain  OBSTETRIC <14 WK [place] AND TRANSVAGINAL OB US  Technique:  Both transabdominal and transvaginal ultrasound examinations were performed for complete evaluation of the gestation as well as the maternal uterus, adnexal regions, and pelvic cul-de-sac.  Transvaginal technique was performed to assess early pregnancy.  Comparison:  None.  Intrauterine gestational sac:  Visualized/normal in shape. Yolk sac: Not visualized. Embryo: Present Cardiac Activity: Present Heart Rate: 162 bpm  CRL: 33.5  mm  10 w  2 d            [place] EDC: 07/25/2012  Maternal uterus/adnexae: Small subchorionic hemorrhage.  Right ovary is within normal limits, measuring 3.4 x 2.9 x 3.0 cm, and is notable for a corpus luteal cyst.  Left ovary is within normal limits, measuring 1.5 x 1.9 x 0.9 cm.  No free fluid.  IMPRESSION: Single live intrauterine gestation with estimated gestational age [redacted] weeks 2 days by crown-rump length.   Original Report Authenticated By: Charline Bills, M.D.     ASSESSMENT 1. Vaginal bleeding in pregnancy   2. Subchorionic hemorrhage     PLAN Called Dr Claiborne Billings to discuss assessment and findings Discharge home with bleeding precautions Pelvic rest, modified bed rest for a few days F/U with prenatal provider Return to MAU as needed    Medication List     As of 12/30/2011  6:31 PM    ASK your doctor about these medications         hydrocortisone cream 1 %   Apply 1 application topically daily as needed. psoriasis      prenatal multivitamin Tabs   Take 1 tablet by mouth daily.         Sharen Counter Certified Nurse-Midwife 12/30/2011  6:31 PM

## 2012-01-17 DIAGNOSIS — Z6740 Type O blood, Rh positive: Secondary | ICD-10-CM

## 2012-01-17 HISTORY — DX: Type O blood, Rh positive: Z67.40

## 2012-01-18 LAB — OB RESULTS CONSOLE GC/CHLAMYDIA: Chlamydia: NEGATIVE

## 2012-01-18 LAB — OB RESULTS CONSOLE ABO/RH: RH Type: POSITIVE

## 2012-01-18 LAB — OB RESULTS CONSOLE RPR: RPR: NONREACTIVE

## 2012-02-14 ENCOUNTER — Ambulatory Visit (INDEPENDENT_AMBULATORY_CARE_PROVIDER_SITE_OTHER): Payer: BC Managed Care – PPO | Admitting: *Deleted

## 2012-02-14 DIAGNOSIS — Z23 Encounter for immunization: Secondary | ICD-10-CM

## 2012-02-15 NOTE — L&D Delivery Note (Signed)
Patient was C/C/+2 and pushed for 50 minutes with epidural.   NSVD  female infant, Apgars 9,9, weight P.   The patient had one midline episiotomy repaired with 2-0 vicryl R. Fundus was firm. EBL was expected but methergine given for some atony that responded to vigorous massage. Placenta was delivered intact. Vagina was clear.  Baby was vigorous to bedside.  Leslie Beck

## 2012-05-08 ENCOUNTER — Other Ambulatory Visit: Payer: Self-pay | Admitting: Obstetrics and Gynecology

## 2012-06-27 LAB — OB RESULTS CONSOLE GBS: GBS: NEGATIVE

## 2012-07-14 ENCOUNTER — Inpatient Hospital Stay (HOSPITAL_COMMUNITY)
Admission: AD | Admit: 2012-07-14 | Discharge: 2012-07-14 | Disposition: A | Discharge status: Home / Self Care | Payer: BC Managed Care – PPO | Source: Ambulatory Visit | Attending: Obstetrics and Gynecology | Admitting: Obstetrics and Gynecology

## 2012-07-14 ENCOUNTER — Encounter (HOSPITAL_COMMUNITY): Payer: Self-pay | Admitting: Family

## 2012-07-14 DIAGNOSIS — O133 Gestational [pregnancy-induced] hypertension without significant proteinuria, third trimester: Secondary | ICD-10-CM

## 2012-07-14 DIAGNOSIS — O139 Gestational [pregnancy-induced] hypertension without significant proteinuria, unspecified trimester: Secondary | ICD-10-CM

## 2012-07-14 LAB — COMPREHENSIVE METABOLIC PANEL
CO2: 21 mEq/L (ref 19–32)
Calcium: 9.3 mg/dL (ref 8.4–10.5)
Creatinine, Ser: 0.65 mg/dL (ref 0.50–1.10)
GFR calc Af Amer: >90 mL/min (ref >90)
GFR calc non Af Amer: >90 mL/min (ref >90)
Glucose, Bld: 94 mg/dL (ref 70–99)
Total Bilirubin: 0.8 mg/dL (ref 0.3–1.2)

## 2012-07-14 LAB — CBC
HCT: 38.5 % (ref 36.0–46.0)
Hemoglobin: 13 g/dL (ref 12.0–15.0)
MCH: 30.7 pg (ref 26.0–34.0)
MCV: 91 fL (ref 78.0–100.0)
RBC: 4.23 MIL/uL (ref 3.87–5.11)

## 2012-07-14 LAB — URINALYSIS, ROUTINE W REFLEX MICROSCOPIC
Bilirubin Urine: NEGATIVE
Glucose, UA: NEGATIVE mg/dL
Hgb urine dipstick: NEGATIVE
Specific Gravity, Urine: 1.01 (ref 1.005–1.030)
pH: 6.5 (ref 5.0–8.0)

## 2012-07-14 NOTE — MAU Note (Signed)
Patient presents to MAU with c/o headache since this morning. Reports her BP has increased over last few weeks and was told to come in for eval if h/a or edema increased. Reports last office visit on Wednesday.  Denies vaginal bleeding, LOF or cramping. Denies blurred vision, RUQ pain, or any other lateralizing s/s.

## 2012-07-14 NOTE — MAU Note (Signed)
Pt reports bp was 145/94 at home starting to have  Headache. Good fetal movement reported. On call MD told her to come in. Denies any ctx.

## 2012-07-14 NOTE — MAU Provider Note (Signed)
History     CSN: [id number]  Arrival date and time: 07/14/12 1257   First Provider Initiated Contact with Patient 07/14/12 1355      Chief Complaint  Patient presents with  . Hypertension   HPI This is a 27 y.o. female at [redacted]w[redacted]d who presents with c/o elevated BP at home and headache. Has had some increasing blood pressures recently and was told to come in if she had symptoms. Was 140/95 at home.   RN Note: Patient presents to MAU with c/o headache since this morning. Reports her BP has increased over last few weeks and was told to come in for eval if h/a or edema increased. Reports last office visit on Wednesday.  Denies vaginal bleeding, LOF or cramping. Denies blurred vision, RUQ pain, or any other lateralizing s/s.       OB History   Grav Para Term Preterm Abortions TAB SAB Ect Mult Living   1               Past Medical History  Diagnosis Date  . ALLERGIC RHINITIS   . Leg pain     Bilat.  No DVT  . Psoriasis   . GERD (gastroesophageal reflux disease)     not on daily PPI anymore  . Recurrent UTI 2012/13  . Asthma     Past Surgical History  Procedure Laterality Date  . No past surgeries      Family History  Problem Relation Age of Onset  . Hypertension Mother   . Hypothyroidism Mother   . Other Mother     varicose veins  . Hypertension Father   . Cancer Maternal Grandfather     colon cancer  . Cancer Paternal Grandmother     colon cancer    History  Substance Use Topics  . Smoking status: Never Smoker   . Smokeless tobacco: Never Used  . Alcohol Use: No    Allergies:  Allergies  Allergen Reactions  . Zithromax (Azithromycin) Rash    Prescriptions prior to admission  Medication Sig Dispense Refill  . clobetasol ointment (TEMOVATE) 0.05 % Apply 1 application topically at bedtime as needed (psoriasis).      . Prenatal Vit-Fe Fumarate-FA (PRENATAL MULTIVITAMIN) TABS Take 1 tablet by mouth daily.        Review of Systems  Constitutional:  Negative for fever, chills and malaise/fatigue.  Cardiovascular: Positive for leg swelling.  Gastrointestinal: Negative for nausea, vomiting, abdominal pain, diarrhea and constipation.  Genitourinary: Negative for dysuria.  Neurological: Positive for headaches. Negative for dizziness.   Physical Exam   Blood pressure 130/79, pulse 97, temperature 98.2 F (36.8 C), temperature source Oral, resp. rate 18, height 5\' 1"  (1.549 m), weight 96.979 kg (213 lb 12.8 oz), last menstrual period 10/16/2011. Filed Vitals:   07/14/12 1335 07/14/12 1347 07/14/12 1402 07/14/12 1417  BP: 130/79 134/82 132/85 128/79  Pulse: 97 87 81 81  Temp:      TempSrc:      Resp:      Height:      Weight:        Physical Exam  Constitutional: She is oriented to person, place, and time. She appears well-developed and well-nourished. No distress.  HENT:  Head: Normocephalic.  Neck: Normal range of motion. Neck supple.  Cardiovascular: Normal rate.   Respiratory: Effort normal.  GI: Soft. She exhibits no distension. There is no tenderness. There is no rebound and no guarding.  Musculoskeletal: Normal range of motion. She exhibits edema (  1+ lower extremities).  Neurological: She is alert and oriented to person, place, and time. She has normal reflexes. She displays normal reflexes (No clonus).  Skin: Skin is warm and dry.  Psychiatric: She has a normal mood and affect.   FHR reactive  Irregular contractions  MAU Course  Procedures  MDM Labs ordered: Results for orders placed during the hospital encounter of 07/14/12 (from the past 24 hour(s))  URINALYSIS, ROUTINE W REFLEX MICROSCOPIC     Status: Abnormal   Collection Time    07/14/12  1:10 PM      Result Value Range   Color, Urine YELLOW  YELLOW   APPearance CLEAR  CLEAR   Specific Gravity, Urine 1.010  1.005 - 1.030   pH 6.5  5.0 - 8.0   Glucose, UA NEGATIVE  NEGATIVE mg/dL   Hgb urine dipstick NEGATIVE  NEGATIVE   Bilirubin Urine NEGATIVE   NEGATIVE   Ketones, ur 15 (*) NEGATIVE mg/dL   Protein, ur NEGATIVE  NEGATIVE mg/dL   Urobilinogen, UA 0.2  0.0 - 1.0 mg/dL   Nitrite NEGATIVE  NEGATIVE   Leukocytes, UA NEGATIVE  NEGATIVE  CBC     Status: Abnormal   Collection Time    07/14/12  2:29 PM      Result Value Range   WBC 14.8 (*) 4.0 - 10.5 K/uL   RBC 4.23  3.87 - 5.11 MIL/uL   Hemoglobin 13.0  12.0 - 15.0 g/dL   HCT 16.1  09.6 - 04.5 %   MCV 91.0  78.0 - 100.0 fL   MCH 30.7  26.0 - 34.0 pg   MCHC 33.8  30.0 - 36.0 g/dL   RDW 40.9  81.1 - 91.4 %   Platelets 189  150 - 400 K/uL  COMPREHENSIVE METABOLIC PANEL     Status: Abnormal   Collection Time    07/14/12  2:29 PM      Result Value Range   Sodium 137  135 - 145 mEq/L   Potassium 3.7  3.5 - 5.1 mEq/L   Chloride 103  96 - 112 mEq/L   CO2 21  19 - 32 mEq/L   Glucose, Bld 94  70 - 99 mg/dL   BUN 7  6 - 23 mg/dL   Creatinine, Ser 7.82  0.50 - 1.10 mg/dL   Calcium 9.3  8.4 - 95.6 mg/dL   Total Protein 5.7 (*) 6.0 - 8.3 g/dL   Albumin 2.5 (*) 3.5 - 5.2 g/dL   AST 14  0 - 37 U/L   ALT 12  0 - 35 U/L   Alkaline Phosphatase 166 (*) 39 - 117 U/L   Total Bilirubin 0.8  0.3 - 1.2 mg/dL   GFR calc non Af Amer >90  >90 mL/min   GFR calc Af Amer >90  >90 mL/min     Assessment and Plan  A: SIUP at [redacted]w[redacted]d       Labile Gestational Hypertension      No evidence of preeclampsia      Reassuring fetal heart rate status  P:  Discussed with Dr Dareen Piano       Discharge home       PIH/Preeclampsia precautions       Followup in office  [facility] 07/14/2012, 2:01 PM

## 2012-07-19 ENCOUNTER — Inpatient Hospital Stay (HOSPITAL_COMMUNITY)
Admission: AD | Admit: 2012-07-19 | Discharge: 2012-07-21 | DRG: 373 | Disposition: A | Discharge status: Home / Self Care | Payer: BC Managed Care – PPO | Source: Ambulatory Visit | Attending: Obstetrics and Gynecology | Admitting: Obstetrics and Gynecology

## 2012-07-19 ENCOUNTER — Inpatient Hospital Stay (HOSPITAL_COMMUNITY): Payer: BC Managed Care – PPO | Admitting: Anesthesiology

## 2012-07-19 ENCOUNTER — Encounter (HOSPITAL_COMMUNITY): Payer: Self-pay | Admitting: *Deleted

## 2012-07-19 ENCOUNTER — Encounter (HOSPITAL_COMMUNITY): Payer: Self-pay | Admitting: Anesthesiology

## 2012-07-19 DIAGNOSIS — O3660X0 Maternal care for excessive fetal growth, unspecified trimester, not applicable or unspecified: Secondary | ICD-10-CM | POA: Diagnosis present

## 2012-07-19 LAB — CBC
HCT: 38.3 % (ref 36.0–46.0)
Hemoglobin: 13.5 g/dL (ref 12.0–15.0)
MCH: 31.3 pg (ref 26.0–34.0)
MCHC: 34.2 g/dL (ref 30.0–36.0)
MCHC: 34.4 g/dL (ref 30.0–36.0)
Platelets: 186 10*3/uL (ref 150–400)
Platelets: 208 10*3/uL (ref 150–400)
RBC: 4.32 MIL/uL (ref 3.87–5.11)
RDW: 13.2 % (ref 11.5–15.5)
WBC: 13.4 10*3/uL — ABNORMAL HIGH (ref 4.0–10.5)

## 2012-07-19 LAB — TYPE AND SCREEN
ABO/RH(D): O POS
Antibody Screen: NEGATIVE

## 2012-07-19 MED ORDER — SENNOSIDES-DOCUSATE SODIUM 8.6-50 MG PO TABS
2.0000 | ORAL_TABLET | Freq: Every day | ORAL | Status: DC
  Administered 2012-07-20 (×2): 2 via ORAL

## 2012-07-19 MED ORDER — WITCH HAZEL-GLYCERIN EX PADS
1.0000 "application " | MEDICATED_PAD | CUTANEOUS | Status: DC | PRN
  Administered 2012-07-20: 1 via TOPICAL

## 2012-07-19 MED ORDER — CITRIC ACID-SODIUM CITRATE 334-500 MG/5ML PO SOLN: 30.0000 mL | ORAL | Status: DC | PRN

## 2012-07-19 MED ORDER — SIMETHICONE 80 MG PO CHEW: 80.0000 mg | CHEWABLE_TABLET | ORAL | Status: DC | PRN

## 2012-07-19 MED ORDER — LACTATED RINGERS IV SOLN: 500.0000 mL | Freq: Once | INTRAVENOUS | Status: DC

## 2012-07-19 MED ORDER — EPHEDRINE 5 MG/ML INJ
10.0000 mg | INTRAVENOUS | Status: DC | PRN
  Filled 2012-07-19: qty 2

## 2012-07-19 MED ORDER — ACETAMINOPHEN 325 MG PO TABS: 650.0000 mg | ORAL_TABLET | ORAL | Status: DC | PRN

## 2012-07-19 MED ORDER — FLEET ENEMA 7-19 GM/118ML RE ENEM: 1.0000 | ENEMA | RECTAL | Status: DC | PRN

## 2012-07-19 MED ORDER — IBUPROFEN 600 MG PO TABS: 600.0000 mg | ORAL_TABLET | Freq: Four times a day (QID) | ORAL | Status: DC | PRN

## 2012-07-19 MED ORDER — LIDOCAINE HCL (PF) 1 % IJ SOLN
INTRAMUSCULAR | Status: DC | PRN
  Administered 2012-07-19 (×2): 4 mL

## 2012-07-19 MED ORDER — OXYTOCIN 40 UNITS IN LACTATED RINGERS INFUSION - SIMPLE MED: 62.5000 mL/h | INTRAVENOUS | Status: DC

## 2012-07-19 MED ORDER — DIPHENHYDRAMINE HCL 50 MG/ML IJ SOLN: 12.5000 mg | INTRAMUSCULAR | Status: DC | PRN

## 2012-07-19 MED ORDER — TERBUTALINE SULFATE 1 MG/ML IJ SOLN: 0.2500 mg | Freq: Once | INTRAMUSCULAR | Status: DC | PRN

## 2012-07-19 MED ORDER — CLOBETASOL PROPIONATE 0.05 % EX OINT
1.0000 "application " | TOPICAL_OINTMENT | Freq: Every evening | CUTANEOUS | Status: DC | PRN
  Filled 2012-07-19: qty 15

## 2012-07-19 MED ORDER — METHYLERGONOVINE MALEATE 0.2 MG/ML IJ SOLN
INTRAMUSCULAR | Status: AC
  Administered 2012-07-19: 0.2 mg
  Filled 2012-07-19: qty 1

## 2012-07-19 MED ORDER — OXYCODONE-ACETAMINOPHEN 5-325 MG PO TABS: 1.0000 | ORAL_TABLET | ORAL | Status: DC | PRN

## 2012-07-19 MED ORDER — SODIUM CHLORIDE 0.9 % IV SOLN: 250.0000 mL | INTRAVENOUS | Status: DC | PRN

## 2012-07-19 MED ORDER — OXYTOCIN BOLUS FROM INFUSION
500.0000 mL | INTRAVENOUS | Status: DC
  Administered 2012-07-19: 500 mL via INTRAVENOUS

## 2012-07-19 MED ORDER — LACTATED RINGERS IV SOLN
INTRAVENOUS | Status: DC
  Administered 2012-07-19: 10:00:00 via INTRAVENOUS

## 2012-07-19 MED ORDER — DIPHENHYDRAMINE HCL 25 MG PO CAPS: 25.0000 mg | ORAL_CAPSULE | Freq: Four times a day (QID) | ORAL | Status: DC | PRN

## 2012-07-19 MED ORDER — DIBUCAINE 1 % RE OINT
1.0000 "application " | TOPICAL_OINTMENT | RECTAL | Status: DC | PRN
  Administered 2012-07-20: 1 via RECTAL
  Filled 2012-07-19: qty 28

## 2012-07-19 MED ORDER — PRENATAL MULTIVITAMIN CH
1.0000 | ORAL_TABLET | Freq: Every day | ORAL | Status: DC
  Administered 2012-07-20 – 2012-07-21 (×2): 1 via ORAL
  Filled 2012-07-19 (×2): qty 1

## 2012-07-19 MED ORDER — MEASLES, MUMPS & RUBELLA VAC ~~LOC~~ INJ
0.5000 mL | INJECTION | Freq: Once | SUBCUTANEOUS | Status: DC
  Filled 2012-07-19: qty 0.5

## 2012-07-19 MED ORDER — LANOLIN HYDROUS EX OINT: TOPICAL_OINTMENT | CUTANEOUS | Status: DC | PRN

## 2012-07-19 MED ORDER — IBUPROFEN 800 MG PO TABS
800.0000 mg | ORAL_TABLET | Freq: Three times a day (TID) | ORAL | Status: DC
  Administered 2012-07-20 – 2012-07-21 (×5): 800 mg via ORAL
  Filled 2012-07-19 (×5): qty 1

## 2012-07-19 MED ORDER — SODIUM CHLORIDE 0.9 % IJ SOLN
3.0000 mL | Freq: Two times a day (BID) | INTRAMUSCULAR | Status: DC
  Administered 2012-07-20: 3 mL via INTRAVENOUS

## 2012-07-19 MED ORDER — TETANUS-DIPHTH-ACELL PERTUSSIS 5-2.5-18.5 LF-MCG/0.5 IM SUSP: 0.5000 mL | Freq: Once | INTRAMUSCULAR | Status: DC

## 2012-07-19 MED ORDER — SODIUM CHLORIDE 0.9 % IJ SOLN: 3.0000 mL | INTRAMUSCULAR | Status: DC | PRN

## 2012-07-19 MED ORDER — PHENYLEPHRINE 40 MCG/ML (10ML) SYRINGE FOR IV PUSH (FOR BLOOD PRESSURE SUPPORT)
80.0000 ug | PREFILLED_SYRINGE | INTRAVENOUS | Status: DC | PRN
  Filled 2012-07-19: qty 2
  Filled 2012-07-19: qty 5

## 2012-07-19 MED ORDER — BUTORPHANOL TARTRATE 1 MG/ML IJ SOLN
1.0000 mg | INTRAMUSCULAR | Status: DC | PRN
  Administered 2012-07-19 (×2): 1 mg via INTRAVENOUS
  Filled 2012-07-19 (×2): qty 1

## 2012-07-19 MED ORDER — FENTANYL 2.5 MCG/ML BUPIVACAINE 1/10 % EPIDURAL INFUSION (WH - ANES)
INTRAMUSCULAR | Status: DC | PRN
  Administered 2012-07-19: 14 mL/h via EPIDURAL

## 2012-07-19 MED ORDER — BENZOCAINE-MENTHOL 20-0.5 % EX AERO
1.0000 "application " | INHALATION_SPRAY | CUTANEOUS | Status: DC | PRN
  Administered 2012-07-20: 1 via TOPICAL
  Filled 2012-07-19: qty 56

## 2012-07-19 MED ORDER — MAGNESIUM HYDROXIDE 400 MG/5ML PO SUSP: 30.0000 mL | ORAL | Status: DC | PRN

## 2012-07-19 MED ORDER — METHYLERGONOVINE MALEATE 0.2 MG PO TABS: 0.2000 mg | ORAL_TABLET | ORAL | Status: DC | PRN

## 2012-07-19 MED ORDER — EPHEDRINE 5 MG/ML INJ
10.0000 mg | INTRAVENOUS | Status: DC | PRN
  Filled 2012-07-19: qty 2
  Filled 2012-07-19: qty 4

## 2012-07-19 MED ORDER — SODIUM BICARBONATE 8.4 % IV SOLN
INTRAVENOUS | Status: DC | PRN
  Administered 2012-07-19: 4 mL via EPIDURAL

## 2012-07-19 MED ORDER — ONDANSETRON HCL 4 MG PO TABS: 4.0000 mg | ORAL_TABLET | ORAL | Status: DC | PRN

## 2012-07-19 MED ORDER — LACTATED RINGERS IV SOLN: 500.0000 mL | INTRAVENOUS | Status: DC | PRN

## 2012-07-19 MED ORDER — ONDANSETRON HCL 4 MG/2ML IJ SOLN: 4.0000 mg | Freq: Four times a day (QID) | INTRAMUSCULAR | Status: DC | PRN

## 2012-07-19 MED ORDER — PHENYLEPHRINE 40 MCG/ML (10ML) SYRINGE FOR IV PUSH (FOR BLOOD PRESSURE SUPPORT)
80.0000 ug | PREFILLED_SYRINGE | INTRAVENOUS | Status: DC | PRN
  Filled 2012-07-19: qty 2

## 2012-07-19 MED ORDER — METHYLERGONOVINE MALEATE 0.2 MG/ML IJ SOLN: 0.2000 mg | INTRAMUSCULAR | Status: DC | PRN

## 2012-07-19 MED ORDER — ZOLPIDEM TARTRATE 5 MG PO TABS: 5.0000 mg | ORAL_TABLET | Freq: Every evening | ORAL | Status: DC | PRN

## 2012-07-19 MED ORDER — ONDANSETRON HCL 4 MG/2ML IJ SOLN: 4.0000 mg | INTRAMUSCULAR | Status: DC | PRN

## 2012-07-19 MED ORDER — OXYTOCIN 40 UNITS IN LACTATED RINGERS INFUSION - SIMPLE MED
1.0000 m[IU]/min | INTRAVENOUS | Status: DC
Start: 2012-07-19 — End: 2012-07-19
  Administered 2012-07-19: 2 m[IU]/min via INTRAVENOUS
  Filled 2012-07-19: qty 1000

## 2012-07-19 MED ORDER — LIDOCAINE HCL (PF) 1 % IJ SOLN
30.0000 mL | INTRAMUSCULAR | Status: DC | PRN
  Filled 2012-07-19 (×2): qty 30

## 2012-07-19 MED ORDER — FENTANYL 2.5 MCG/ML BUPIVACAINE 1/10 % EPIDURAL INFUSION (WH - ANES)
14.0000 mL/h | INTRAMUSCULAR | Status: DC | PRN
  Filled 2012-07-19: qty 125

## 2012-07-19 MED ORDER — FERROUS SULFATE 325 (65 FE) MG PO TABS
325.0000 mg | ORAL_TABLET | Freq: Two times a day (BID) | ORAL | Status: DC
  Administered 2012-07-20 – 2012-07-21 (×3): 325 mg via ORAL
  Filled 2012-07-19 (×3): qty 1

## 2012-07-19 NOTE — H&P (Signed)
27 y.o. [redacted]w[redacted]d  G1P0 comes in for induction at term, LGA.  Otherwise has good fetal movement and no bleeding.  Past Medical History  Diagnosis Date  . ALLERGIC RHINITIS   . Leg pain     Bilat.  No DVT  . Psoriasis   . GERD (gastroesophageal reflux disease)     not on daily PPI anymore  . Recurrent UTI 2012/13  . Asthma     Past Surgical History  Procedure Laterality Date  . No past surgeries      OB History   Grav Para Term Preterm Abortions TAB SAB Ect Mult Living   1              # Outc Date GA Lbr Len/2nd Wgt Sex Del Anes PTL Lv   1 CUR               History   Social History  . Marital Status: Married    Spouse Name: N/A    Number of Children: N/A  . Years of Education: N/A   Occupational History  . Not on file.   Social History Main Topics  . Smoking status: Never Smoker   . Smokeless tobacco: Never Used  . Alcohol Use: No  . Drug Use: No  . Sexually Active: Yes -- Female partner(s)    Birth Control/ Protection: None   Other Topics Concern  . Not on file   Social History Narrative   Married, no children.   Orig from [place], went to [employer].   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   No T/A/Ds.   Sees Dr. Henderson Cloud for GYN.   Zithromax    Prenatal Transfer Tool  Maternal Diabetes: No Genetic Screening: Normal Maternal Ultrasounds/Referrals: Normal Fetal Ultrasounds or other Referrals:  None Maternal Substance Abuse:  No Significant Maternal Medications:  None Significant Maternal Lab Results: None  Other MWU:[id number].    There were no vitals filed for this visit.   Lungs/Cor:  NAD Abdomen:  soft, gravid Ex:  no cords, erythema SVE:  3/70/-2, AROM clear. FHTs:  130s, good STV, NST R Toco:  q occ   A/P   Term for induction for LGA.  GBS neg.  Vincente Asbridge A

## 2012-07-19 NOTE — Anesthesia Preprocedure Evaluation (Signed)
Anesthesia Evaluation  Patient identified by MRN, date of birth, ID band Patient awake    Reviewed: Allergy & Precautions, H&P , Patient's Chart, lab work & pertinent test results  Airway Mallampati: III TM Distance: >3 FB Neck ROM: full    Dental no notable dental hx. (+) Teeth Intact   Pulmonary asthma ,  breath sounds clear to auscultation  Pulmonary exam normal       Cardiovascular negative cardio ROS  Rhythm:regular Rate:Normal     Neuro/Psych negative neurological ROS  negative psych ROS   GI/Hepatic Neg liver ROS, GERD-  Medicated and Controlled,  Endo/Other  Morbid obesity  Renal/GU negative Renal ROS   Recurrent UTI    Musculoskeletal   Abdominal Normal abdominal exam  (+)   Peds  Hematology negative hematology ROS (+)   Anesthesia Other Findings   Reproductive/Obstetrics (+) Pregnancy                           Anesthesia Physical Anesthesia Plan  ASA: II  Anesthesia Plan: Epidural   Post-op Pain Management:    Induction:   Airway Management Planned:   Additional Equipment:   Intra-op Plan:   Post-operative Plan:   Informed Consent: I have reviewed the patients History and Physical, chart, labs and discussed the procedure including the risks, benefits and alternatives for the proposed anesthesia with the patient or authorized representative who has indicated his/her understanding and acceptance.     Plan Discussed with: Anesthesiologist  Anesthesia Plan Comments:         Anesthesia Quick Evaluation

## 2012-07-19 NOTE — Anesthesia Procedure Notes (Signed)
Epidural Patient location during procedure: OB Start time: 07/19/2012 7:44 PM  Staffing Anesthesiologist: Jocelyn Nold A. Performed by: anesthesiologist   Preanesthetic Checklist Completed: patient identified, site marked, surgical consent, pre-op evaluation, timeout performed, IV checked, risks and benefits discussed and monitors and equipment checked  Epidural Patient position: sitting Prep: site prepped and draped and DuraPrep Patient monitoring: continuous pulse ox and blood pressure Approach: midline Injection technique: LOR air  Needle:  Needle type: Tuohy  Needle gauge: 17 G Needle length: 9 cm and 9 Needle insertion depth: 7 cm Catheter type: closed end flexible Catheter size: 19 Gauge Catheter at skin depth: 12 cm Test dose: negative and Other  Assessment Events: blood not aspirated, injection not painful, no injection resistance, negative IV test and no paresthesia  Additional Notes Patient identified. Risks and benefits discussed including failed block, incomplete  Pain control, post dural puncture headache, nerve damage, paralysis, blood pressure Changes, nausea, vomiting, reactions to medications-both toxic and allergic and post Partum back pain. All questions were answered. Patient expressed understanding and wished to proceed. Sterile technique was used throughout procedure. Epidural site was Dressed with sterile barrier dressing. No paresthesias, signs of intravascular injection Or signs of intrathecal spread were encountered.  Patient was more comfortable after the epidural was dosed. Please see RN's note for documentation of vital signs and FHR which are stable.

## 2012-07-20 LAB — CBC
HCT: 33.8 % — ABNORMAL LOW (ref 36.0–46.0)
Hemoglobin: 11.4 g/dL — ABNORMAL LOW (ref 12.0–15.0)
MCH: 31 pg (ref 26.0–34.0)
MCHC: 33.7 g/dL (ref 30.0–36.0)
MCV: 91.8 fL (ref 78.0–100.0)

## 2012-07-20 NOTE — Progress Notes (Signed)
Post Partum Day 1 Subjective: no complaints, up ad lib, voiding and tolerating PO  Objective: Blood pressure 121/68, pulse 98, temperature 98.2 F (36.8 C), temperature source Oral, resp. rate 18, height 5\' 1"  (1.549 m), weight 97.07 kg (214 lb), last menstrual period 10/16/2011, SpO2 97.00%, unknown if currently breastfeeding.  Physical Exam:  General: alert, cooperative and appears stated age Lochia: appropriate Uterine Fundus: firm   Recent Labs  07/19/12 1840 07/20/12 0607  HGB 13.5 11.4*  HCT 39.3 33.8*    Assessment/Plan: Routine PP care   LOS: 1 day   Leslie Beck H. 07/20/2012, 10:18 AM

## 2012-07-20 NOTE — Anesthesia Postprocedure Evaluation (Signed)
Anesthesia Post Note  Patient: Leslie Beck  Procedure(s) Performed: * No procedures listed *  Anesthesia type: Epidural  Patient location: Mother/Baby  Post pain: Pain level controlled  Post assessment: Post-op Vital signs reviewed  Last Vitals:  Filed Vitals:   07/20/12 0420  BP: 121/68  Pulse: 98  Temp: 36.8 C  Resp: 18    Post vital signs: Reviewed  Level of consciousness:alert  Complications: No apparent anesthesia complications

## 2012-07-21 MED ORDER — DOCUSATE SODIUM 100 MG PO CAPS: 100.0000 mg | ORAL_CAPSULE | Freq: Two times a day (BID) | ORAL | Status: DC

## 2012-07-21 MED ORDER — IBUPROFEN 600 MG PO TABS: 600.0000 mg | ORAL_TABLET | Freq: Four times a day (QID) | ORAL | Status: DC | PRN

## 2012-07-21 MED ORDER — OXYCODONE-ACETAMINOPHEN 5-325 MG PO TABS: 2.0000 | ORAL_TABLET | ORAL | Status: DC | PRN

## 2012-07-21 NOTE — Discharge Summary (Signed)
Obstetric Discharge Summary Reason for Admission: onset of labor Prenatal Procedures: ultrasound Intrapartum Procedures: spontaneous vaginal delivery Postpartum Procedures: none Complications-Operative and Postpartum: 2nd degree perineal laceration Hemoglobin  Date Value Range Status  07/20/2012 11.4* 12.0 - 15.0 g/dL Final     HCT  Date Value Range Status  07/20/2012 33.8* 36.0 - 46.0 % Final    Physical Exam:  General: alert, cooperative and appears stated age 75: appropriate Uterine Fundus: firm Incision: healing well DVT Evaluation: No evidence of DVT seen on physical exam.  Discharge Diagnoses: Term Pregnancy-delivered  Discharge Information: Date: 07/21/2012 Activity: pelvic rest Diet: routine Medications: Ibuprofen, Colace and Percocet Condition: improved Instructions: refer to practice specific booklet Discharge to: home Follow-up Information   Follow up with HORVATH,MICHELLE A, MD In 4 weeks. (For a postpartum evaluation)    Contact information:   [address] [place] [address] [phone]       Newborn Data: Live born female  Birth Weight: 7 lb 6.3 oz (3355 g) APGAR: 9, 9  Home with mother.  Gavyn Ybarra H. 07/21/2012, 7:22 AM

## 2013-02-03 IMAGING — US US SOFT TISSUE HEAD/NECK
1 series · 13 of 14 positions shown · non-contrast
Comparison: Prior ultrasound dated 07/28/2011

CLINICAL DATA: Enlarged solitary lymph node in the left neck. The
patient presents for possible biopsy.  She states that the lymph
node has decreased in size over the last few days.

ULTRASOUND OF HEAD/NECK SOFT TISSUES
TECHNIQUE: Ultrasound examination of the head and neck soft
tissues was performed in the area of clinical concern.

[Series 1: us soft tissue head/neck · 0.07mm/px · 13 of 14 slices shown]
[im 1/14]
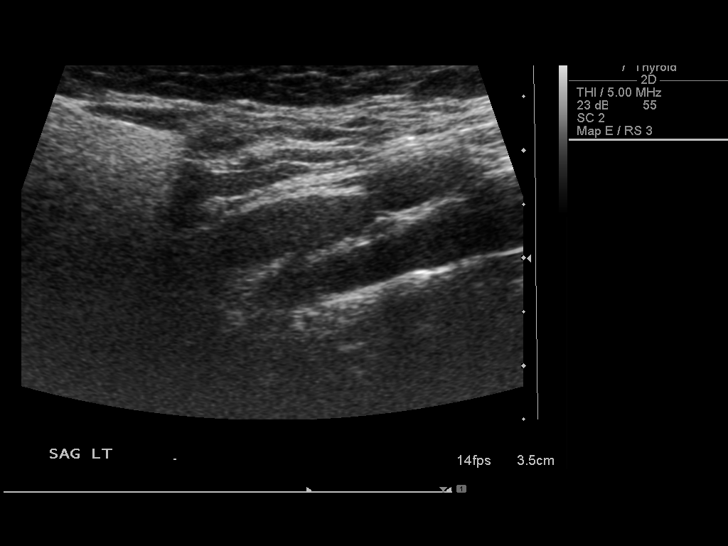
[im 2/14]
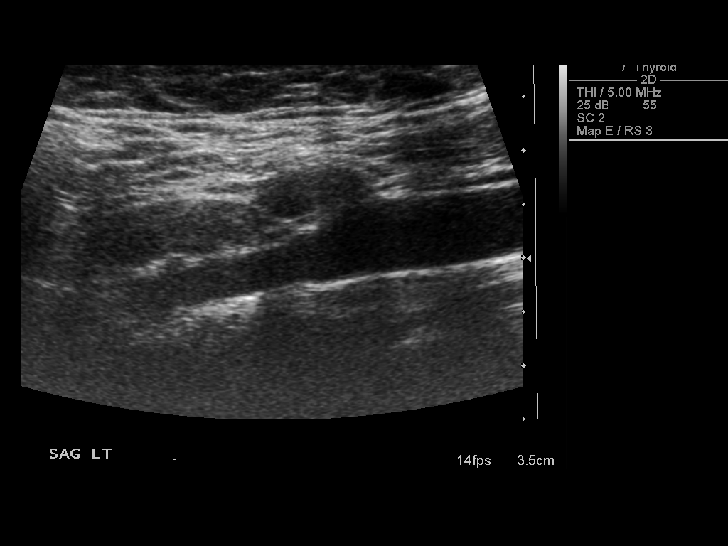
[im 3/14]
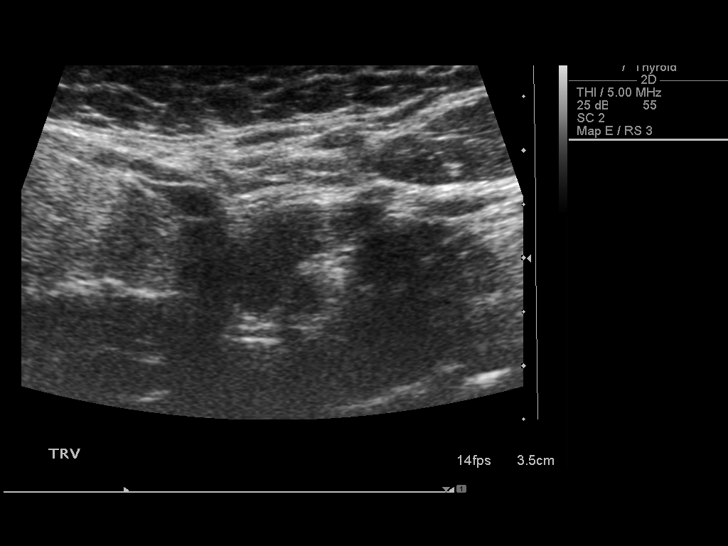
[im 4/14]
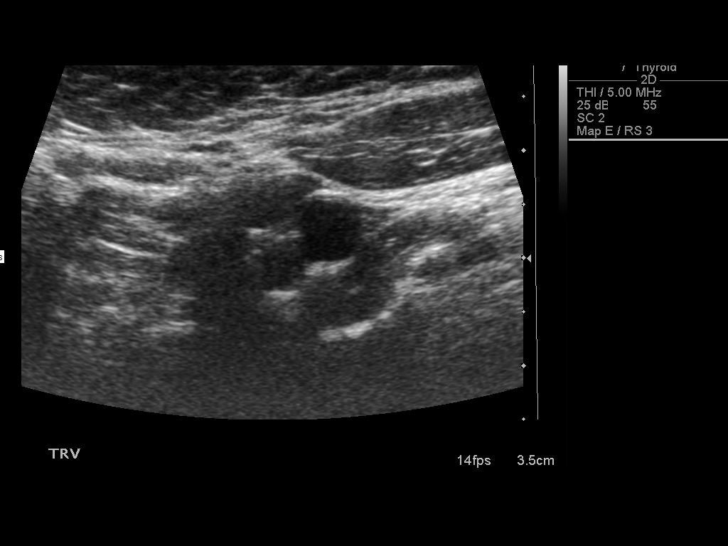
[im 5/14]
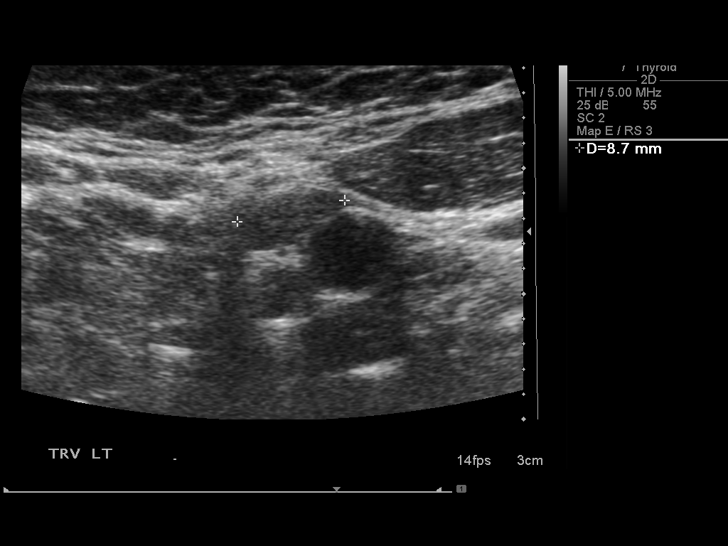
[im 6/14]
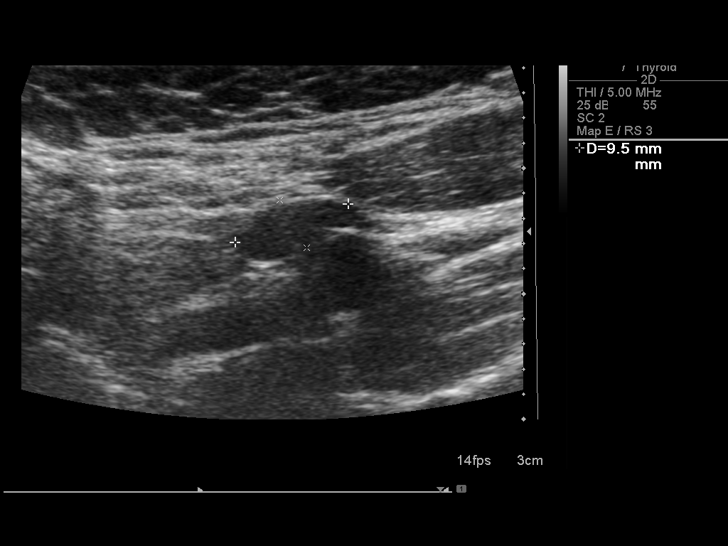
[im 8/14]
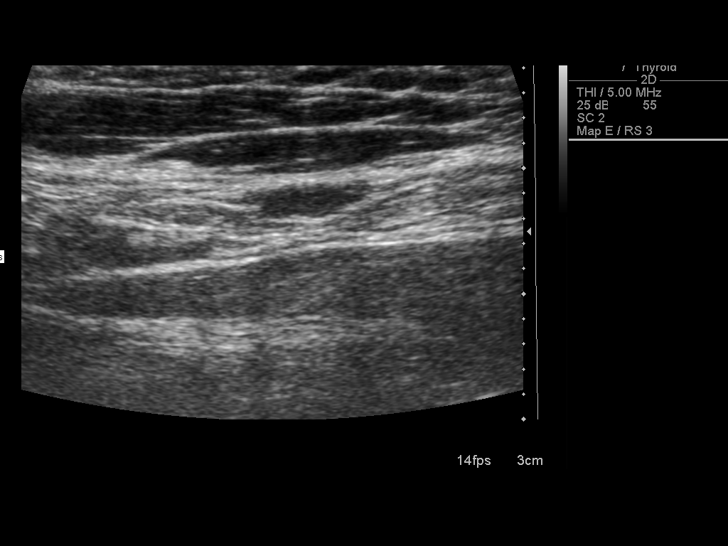
[im 9/14]
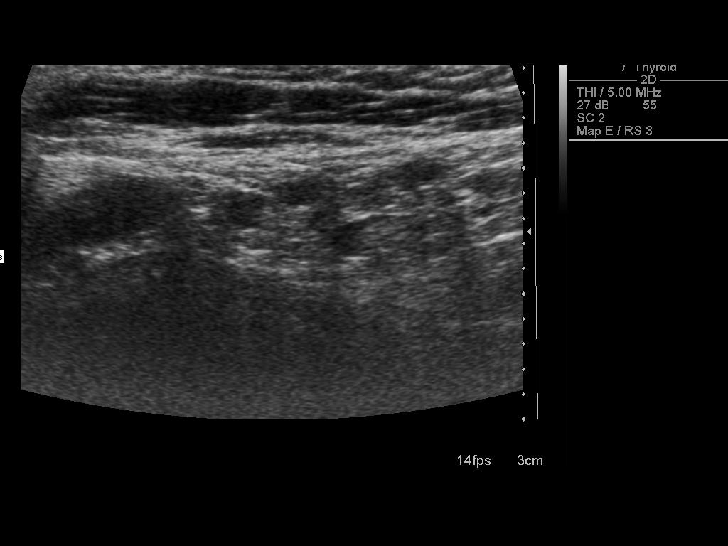
[im 10/14]
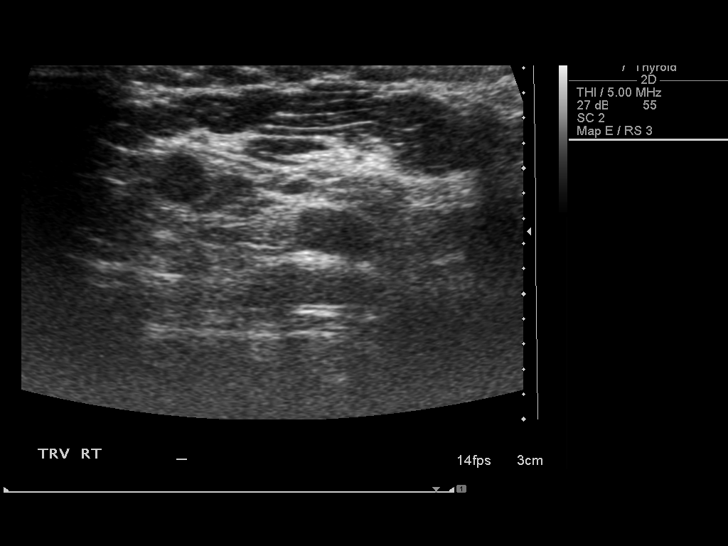
[im 11/14]
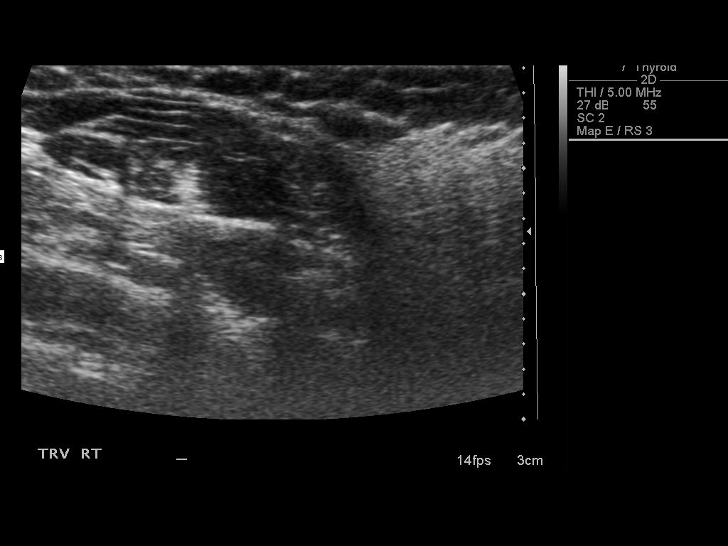
[im 12/14]
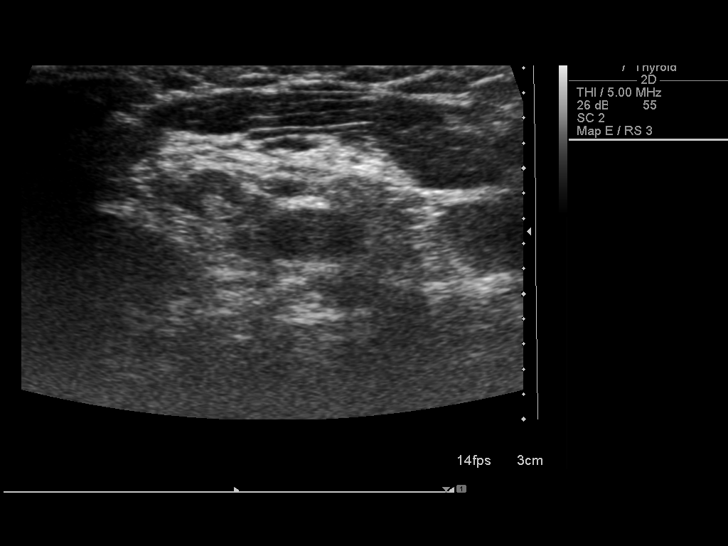
[im 13/14]
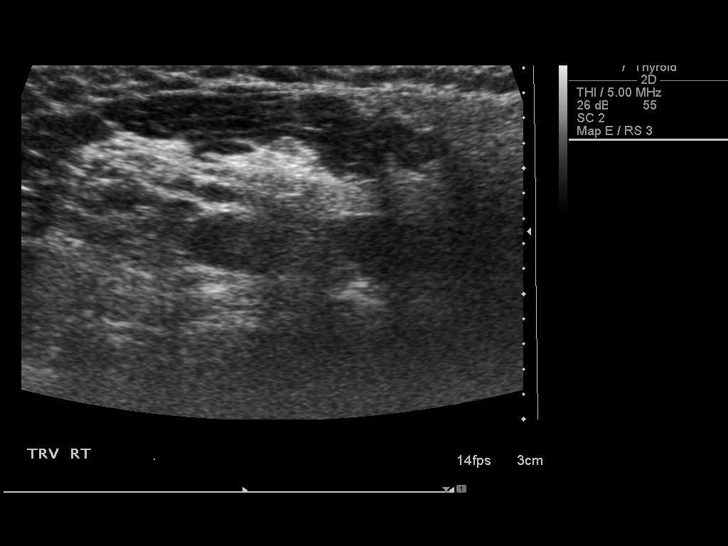
[im 14/14]
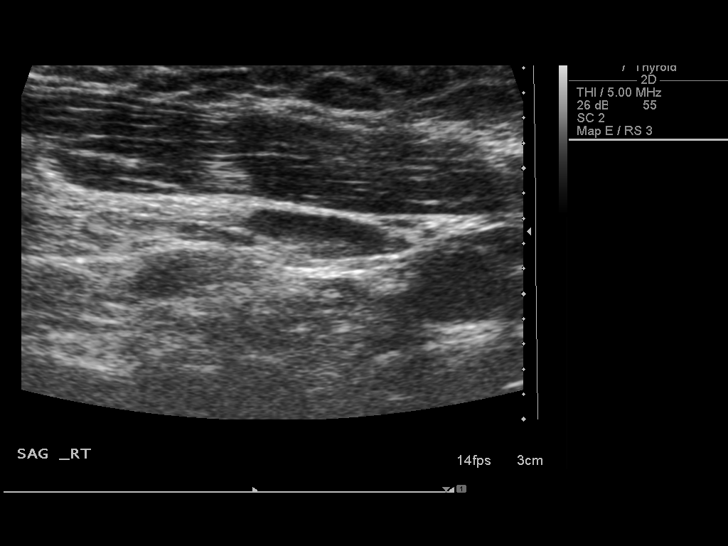

[13 of 14 positions shown; findings below may reference images not displayed]

FINDINGS: Ultrasound was performed of the neck.  In the region of
the previously identified and measured left cervical node located
just inferior to the thyroid gland, a lymph node is identified
which is smaller in size compared to the prior ultrasound.  Current
dimensions are approximately 0.9 x 0.4 x 1.0 cm.  Based on decrease
in size, biopsy was not performed today.  Survey of both right and
left sides of the neck by ultrasound reveal no enlarged lymph
nodes.
IMPRESSION: Decrease in size of the left cervical lymph node previously
identified by ultrasound.  Biopsy was not performed.  If there are
any increasing palpable lymph nodes by clinical exam in the future,
recommend further correlation with CT of the neck with contrast
prior to making a decision regarding whether to perform future
biopsy.

## 2013-02-21 ENCOUNTER — Ambulatory Visit (INDEPENDENT_AMBULATORY_CARE_PROVIDER_SITE_OTHER): Payer: BC Managed Care – PPO | Admitting: Nurse Practitioner

## 2013-02-21 ENCOUNTER — Encounter: Payer: Self-pay | Admitting: Nurse Practitioner

## 2013-02-21 VITALS — BP 110/80 | HR 96 | Temp 98.2°F | Ht 62.0 in

## 2013-02-21 DIAGNOSIS — L409 Psoriasis, unspecified: Secondary | ICD-10-CM

## 2013-02-21 DIAGNOSIS — L408 Other psoriasis: Secondary | ICD-10-CM

## 2013-02-21 DIAGNOSIS — R590 Localized enlarged lymph nodes: Secondary | ICD-10-CM

## 2013-02-21 DIAGNOSIS — R599 Enlarged lymph nodes, unspecified: Secondary | ICD-10-CM

## 2013-02-21 MED ORDER — CLOBETASOL PROPIONATE 0.05 % EX OINT: 1.0000 "application " | TOPICAL_OINTMENT | Freq: Every evening | CUTANEOUS | Status: DC | PRN

## 2013-02-21 MED ORDER — CLOBETASOL PROPIONATE 0.05 % EX CREA: 1.0000 "application " | TOPICAL_CREAM | Freq: Every morning | CUTANEOUS | Status: DC

## 2013-02-21 NOTE — Progress Notes (Signed)
Pre-visit discussion using our clinic review tool. No additional management support is needed unless otherwise documented below in the visit note.  

## 2013-02-21 NOTE — Patient Instructions (Addendum)
Start daily sinus rinses. If lymph node swelling does not resolve and you do not have sinus drainage, please call for re-eval. Continue skin care as discussed, start topical steroid.   Psoriasis Psoriasis is a common, long-lasting (chronic) inflammation of the skin. It affects both men and women equally, of all ages and all races. Psoriasis cannot be passed from person to person (not contagious). Psoriasis varies from mild to very severe. When severe, it can greatly affect your quality of life. Psoriasis is an inflammatory disorder affecting the skin as well as other organs including the joints (causing an arthritis). With psoriasis, the skin sheds its top layer of cells more rapidly than it does in someone without psoriasis. CAUSES  The cause of psoriasis is largely unknown. Genetics, your immune system, and the environment seem to play a role in causing psoriasis. Factors that can make psoriasis worse include:  Damage or trauma to the skin, such as cuts, scrapes, and sunburn. This damage often causes new areas of psoriasis (lesions).  Winter dryness and lack of sunlight.  Medicines such as lithium, beta-blockers, antimalarial drugs, ACE inhibitors, nonsteroidal anti-inflammatory drugs (ibuprofen, aspirin), and terbinafine. Let your caregiver know if you are taking any of these drugs.  Alcohol. Excessive alcohol use should be avoided if you have psoriasis. Drinking large amounts of alcohol can affect:  How well your psoriasis treatment works.  How safe your psoriasis treatment is.  Smoking. If you smoke, ask your caregiver for help to quit.  Stress.  Bacterial or viral infections.  Arthritis. Arthritis associated with psoriasis (psoriatic arthritis) affects less than 10% of patients with psoriasis. The arthritic intensity does not always match the skin psoriasis intensity. It is important to let your caregiver know if your joints hurt or if they are stiff. SYMPTOMS  The most common form  of psoriasis begins with little red bumps that gradually become larger. The bumps begin to form scales that flake off easily. The lower layers of scales stick together. When these scales are scratched or removed, the underlying skin is tender and bleeds easily. These areas then grow in size and may become large. Psoriasis often creates a rash that looks the same on both sides of the body (symmetrical). It often affects the elbows, knees, groin, genitals, arms, legs, scalp, and nails. Affected nails often have pitting, loosen, thicken, crumble, and are difficult to treat.  "Inverse psoriasis"occurs in the armpits, under breasts, in skin folds, and around the groin, buttocks, and genitals.  "Guttate psoriasis" generally occurs in children and young adults following a recent sore throat (strep throat). It begins with many small, red, scaly spots on the skin. It clears spontaneously in weeks or a few months without treatment. DIAGNOSIS  Psoriasis is diagnosed by physical exam. A tissue sample (biopsy) may also be taken. TREATMENT The treatment of psoriasis depends on your age, health, and living conditions.  Steroid (cortisone) creams, lotions, and ointments may be used. These treatments are associated with thinning of the skin, blood vessels that get larger (dilated), loss of skin pigmentation, and easy bruising. It is important to use these steroids as directed by your caregiver. Only treat the affected areas and not the normal, unaffected skin. People on long-term steroid treatment should wear a medical alert bracelet. Injections may be used in areas that are difficult to treat.  Scalp treatments are available as shampoos, solutions, sprays, foams, and oils. Avoid scratching the scalp and picking at the scales.  Anthralin medicine works well on areas that  are difficult to treat. However, it stains clothes and skin and may cause temporary irritation.  Synthetic vitamin D (calcipotriene)can be used on  small areas. It is available by prescription. The forms of synthetic vitamin D available in health food stores do not help with psoriasis.  Coal tarsare available in various strengths for psoriasis that is difficult to treat. They are one of the longest used treatments for difficult to treat psoriasis. However, they are messy to use.  Light therapy (UV therapy) can be carefully and professionally monitored in a dermatologist's office. Careful sunbathing is helpful for many people as directed by your caregiver. The exposure should be just long enough to cause a mild redness (erythema) of your skin. Avoid sunburn as this may make the condition worse. Sunscreen (SPF of 30 or higher) should be used to protect against sunburn. Cataracts, wrinkles, and skin aging are some of the harmful side effects of light therapy.  If creams (topical medicines) fail, there are several other options for systemic or oral medicines your caregiver can suggest. Psoriasis can sometimes be very difficult to treat. It can come and go. It is necessary to follow up with your caregiver regularly if your psoriasis is difficult to treat. Usually, with persistence you can get a good amount of relief. Maintaining consistent care is important. Do not change caregivers just because you do not see immediate results. It may take several trials to find the right combination of treatment for you. PREVENTING FLARE-UPS  Wear gloves while you wash dishes, while cleaning, and when you are outside in the cold.  If you have radiators, place a bowl of water or damp towel on the radiator. This will help put water back in the air. You can also use a humidifier to keep the air moist. Try to keep the humidity at about 60% in your home.  Apply moisturizer while your skin is still damp from bathing or showering. This traps water in the skin.  Avoid long, hot baths or showers. Keep soap use to a minimum. Soaps dry out the skin and wash away the  protective oils. Use a fragrance free, dye free soap.  Drink enough water and fluids to keep your urine clear or pale yellow. Not drinking enough water depletes your skin's water supply.  Turn off the heat at night and keep it low during the day. Cool air is less drying. SEEK MEDICAL CARE IF:  You have increasing pain in the affected areas.  You have uncontrolled bleeding in the affected areas.  You have increasing redness or warmth in the affected areas.  You start to have pain or stiffness in your joints.  You start feeling depressed about your condition.  You have a fever. Document Released: 01/29/2000 Document Revised: 04/25/2011 Document Reviewed: 07/26/2010 [facility] Patient Information 2014 [place], [place].

## 2013-02-21 NOTE — Progress Notes (Signed)
   Subjective:    Patient ID: Leslie Beck, female    DOB: 09/27/1985, 28 y.o.   MRN: [id number]  HPI Comments: Family History of psoriasis-father.  Psoriasis This is a recurrent problem. The current episode started 1 to 4 weeks ago (few weeks). The problem occurs constantly. The problem has been gradually worsening since onset. The affected locations include the left hand, left fingers, left foot, right hand, right fingers and right foot. Associated symptoms include itching. Pertinent negatives include no arthritis, nail changes, plaques or pustules. Exacerbated by: cold weather, dry Air. Past treatments include moisturizers (OTC cortisone. Used clobetasol 1 yr ago w/improvement.). The treatment provided no relief. She has been using treatment for 1 to 4 weeks.      Review of Systems  Constitutional: Negative for fever, activity change, appetite change and fatigue.  HENT: Positive for congestion (nasal). Negative for sore throat.   Eyes: Negative for redness.  Respiratory: Negative for cough.   Gastrointestinal: Negative for diarrhea.  Musculoskeletal: Negative for arthralgias and arthritis.  Skin: Positive for itching and rash (hands & feet). Negative for nail changes.  Hematological: Positive for adenopathy (anterior cervical).       Objective:   Physical Exam  Vitals reviewed. Constitutional: She is oriented to person, place, and time. She appears well-developed and well-nourished. No distress.  HENT:  Head: Normocephalic and atraumatic.  Right Ear: External ear normal.  Left Ear: External ear normal.  Mouth/Throat: Oropharynx is clear and moist. No oropharyngeal exudate.  Tonsils +2, L TM retracted, R TM effusion, clear fluid  Eyes: Conjunctivae are normal. Right eye exhibits no discharge. Left eye exhibits no discharge.  Neck: Normal range of motion. Neck supple. No thyromegaly present.  Cardiovascular: Normal rate.   Pulmonary/Chest: Effort normal.  Musculoskeletal:  She exhibits tenderness (tender dip joints in bilat hands to palpation).  Lymphadenopathy:    She has cervical adenopathy (bilat anterior cervical LAD, Tender to palpate).  Neurological: She is alert and oriented to person, place, and time.  Skin: Skin is warm and dry. Rash (all fingers & thumbs, insoles  of feet. mostly papules on hands w/plaque & papules on feet.) noted.  Psychiatric: She has a normal mood and affect. Her behavior is normal. Thought content normal.          Assessment & Plan:  1. Psoriasis  - clobetasol ointment (TEMOVATE) 0.05 %; Apply 1 application topically at bedtime as needed (psoriasis).  Dispense: 30 g; Refill: 1 - clobetasol cream (TEMOVATE) 0.05 %; Apply 1 application topically every morning.  Dispense: 30 g; Refill: 1 - Ambulatory referral to Dermatology  2 LAD bilat cervical LAD likelt r/t recent URI See pt instructions.

## 2013-03-22 ENCOUNTER — Telehealth: Payer: Self-pay | Admitting: *Deleted

## 2013-03-22 NOTE — Telephone Encounter (Signed)
Patient stated that she does have an appointment with dermatology 04/03/13.

## 2013-03-22 NOTE — Telephone Encounter (Signed)
Patient left vm requesting medication. Pt stated that cream for her psoriasis is not really helping. Pt said that she would like to try the prednisone that you suggested at her last ov. Pt said that if you need her to come back in for another ov for this she is willing to do that. Please advise?

## 2013-03-22 NOTE — Telephone Encounter (Signed)
Oral prednisone is not typical treatment for psoriasis. If she is not improving, she needs to see dermatology for different type of topical treatment. I referred her at last visit. HAs she gotten an appointment?

## 2013-06-10 ENCOUNTER — Telehealth: Payer: Self-pay | Admitting: Family Medicine

## 2013-06-10 ENCOUNTER — Ambulatory Visit (INDEPENDENT_AMBULATORY_CARE_PROVIDER_SITE_OTHER): Payer: BC Managed Care – PPO | Admitting: Family Medicine

## 2013-06-10 ENCOUNTER — Encounter: Payer: Self-pay | Admitting: Family Medicine

## 2013-06-10 VITALS — BP 118/85 | HR 96 | Temp 98.0°F | Resp 18 | Ht 62.0 in | Wt 194.0 lb

## 2013-06-10 DIAGNOSIS — L409 Psoriasis, unspecified: Secondary | ICD-10-CM

## 2013-06-10 DIAGNOSIS — L408 Other psoriasis: Secondary | ICD-10-CM

## 2013-06-10 MED ORDER — FLUCONAZOLE 150 MG PO TABS: ORAL_TABLET | ORAL | Status: DC

## 2013-06-10 MED ORDER — MUPIROCIN 2 % EX OINT: 1.0000 "application " | TOPICAL_OINTMENT | Freq: Two times a day (BID) | CUTANEOUS | Status: DC

## 2013-06-10 MED ORDER — SULFAMETHOXAZOLE-TMP DS 800-160 MG PO TABS: 1.0000 | ORAL_TABLET | Freq: Two times a day (BID) | ORAL | Status: DC

## 2013-06-10 NOTE — Progress Notes (Signed)
OFFICE NOTE  06/10/2013  CC:  Chief Complaint  Patient presents with  . Boil    right ear drained last night     HPI: Patient is a 28 y.o. Caucasian female who is here for right ear complaint. Onset of right ear pain 4 d/a, in the context of some recent nasal congestion.   No fever.  Pain maximized and she went to [facility] yesterday and was told it was swollen inside and had a "boil". She uses Q tips but not deep in canal. Last night ear began to drain and it feels a bit less painful but still very full/stuffy. Left ear did not bother her at all.  LMP 06/05/13  Pertinent PMH:  Past Medical History  Diagnosis Date  . ALLERGIC RHINITIS   . Leg pain     Bilat.  No DVT  . Psoriasis   . GERD (gastroesophageal reflux disease)     not on daily PPI anymore  . Recurrent UTI 2012/13  . Asthma    Past Surgical History  Procedure Laterality Date  . No past surgeries     History   Social History Narrative   Married, one child.   Orig from [place], went to [employer].   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   No T/A/Ds.   Sees Dr. Henderson CloudHorvath for GYN.    MEDS:  Outpatient Prescriptions Prior to Visit  Medication Sig Dispense Refill  . clobetasol cream (TEMOVATE) 0.05 % Apply 1 application topically every morning.  30 g  1  . clobetasol ointment (TEMOVATE) 0.05 % Apply 1 application topically at bedtime as needed (psoriasis).  30 g  1  . ibuprofen (ADVIL,MOTRIN) 600 MG tablet Take 1 tablet (600 mg total) by mouth every 6 (six) hours as needed for pain.  90 tablet  0   No facility-administered medications prior to visit.    PE: Blood pressure 118/85, pulse 96, temperature 98 F (36.7 C), temperature source Oral, resp. rate 18, height 5\' 2"  (1.575 m), weight 194 lb (87.998 kg), last menstrual period 06/05/2013, SpO2 99.00%. Gen: Alert, well appearing.  Patient is oriented to person, place, time, and situation. Left EAC and TM normal. Right external ear anatomy with mild  discomfort to palpation but no erythema or swelling. On posterior wall of EAC in proximal EAC there is a 1-2 mm superficial erythematous and moist region with a bit of yellowish to the periphery.  No other area of EAC is red or swollen.  No drainage in EAC.  TM is normal. No mastoid tenderness, no preauricular tenderness.  IMPRESSION AND PLAN:  1) Right EAC with small pustule, draining now. Bactrim DS 1 bid + bactroban ointment to the site with q tip tid. Warm compress prn.  2) Psoriasis: pt requests 2nd opinion for eval and treatment of this (current derm is [facility]).  An After Visit Summary was printed and given to the patient.  FOLLOW UP: prn

## 2013-06-10 NOTE — Progress Notes (Signed)
Pre visit review using our clinic review tool, if applicable. No additional management support is needed unless otherwise documented below in the visit note. 

## 2013-06-10 NOTE — Addendum Note (Signed)
Addended by: Jeoffrey MassedMCGOWEN, PHILIP H on: 06/10/2013 01:41 PM   Modules accepted: Orders

## 2013-06-10 NOTE — Telephone Encounter (Signed)
I prescribed her bactrim pills and bactroban ointment. Which one is she worried about and ask specifically why (because I chose these antibiotics to specifically treat her condition, and I don't have confidence that amoxicillin will be adequate for this).-thx

## 2013-06-11 NOTE — Telephone Encounter (Signed)
Spoke with pt, the pharmacist told her that Sulfa drugs are easy to be allergic to and after reading the side effects, she got nervous about taking the medication. She has only taken amoxicillin in the past without any problems so she was just wondering if she could possibly take this instead. I advised her that Amox would not be adequate for her condition. Pt understood. She has not taken the medication yet. Please advise.

## 2013-06-11 NOTE — Telephone Encounter (Signed)
LMOM to CB. 

## 2013-06-11 NOTE — Telephone Encounter (Signed)
Spoke with pt, advised message from Dr Milinda CaveMcGowen. Pt understood and will take the Bactrim.

## 2013-06-11 NOTE — Telephone Encounter (Signed)
Please tell her that I want her to take the bactrim. As far as the pharmacist's comment about sulfa drugs "are easy to be allergic to", tell that there are many meds like that (including amoxicillin) but that does not mean she is necessarily going to be allergic to it.  Reassure pt.  I have no further advice.  -thx

## 2013-08-19 ENCOUNTER — Encounter: Payer: Self-pay | Admitting: Family Medicine

## 2013-10-23 ENCOUNTER — Other Ambulatory Visit: Payer: Self-pay | Admitting: Obstetrics and Gynecology

## 2013-10-24 LAB — CYTOLOGY - PAP

## 2013-12-16 ENCOUNTER — Encounter: Payer: Self-pay | Admitting: Family Medicine

## 2013-12-30 ENCOUNTER — Encounter: Payer: Self-pay | Admitting: Family Medicine

## 2013-12-30 ENCOUNTER — Ambulatory Visit (INDEPENDENT_AMBULATORY_CARE_PROVIDER_SITE_OTHER): Payer: BC Managed Care – PPO | Admitting: Family Medicine

## 2013-12-30 VITALS — BP 128/88 | HR 85 | Temp 98.2°F | Resp 18 | Ht 62.0 in | Wt 192.0 lb

## 2013-12-30 DIAGNOSIS — L720 Epidermal cyst: Secondary | ICD-10-CM

## 2013-12-30 DIAGNOSIS — L02818 Cutaneous abscess of other sites: Secondary | ICD-10-CM

## 2013-12-30 MED ORDER — MUPIROCIN 2 % EX OINT: TOPICAL_OINTMENT | CUTANEOUS | Status: DC

## 2013-12-30 NOTE — Progress Notes (Signed)
OFFICE NOTE  12/30/2013  CC:  Chief Complaint  Patient presents with  . Ear Pain    off and on x 3 days   HPI: Patient is a 28 y.o. Caucasian female who is here for right ear pain. Onset 3 d/a, some nasal drainage accompanied it.  Ear pain worsening, throbbing.  No fever. Saw some drainage from ear on first day.  No ST.  No cough.  Some peri-orbital HA off and on.   Pertinent PMH:  Past medical, surgical, social, and family history reviewed and no changes noted since last office visit.  MEDS:  Clobetasol 0.05% ointment qhs prn  PE: Blood pressure 128/88, pulse 85, temperature 98.2 F (36.8 C), temperature source Temporal, resp. rate 18, height 5\' 2"  (1.575 m), weight 192 lb (87.091 kg), SpO2 99 %, not currently breastfeeding. Right ear TTP but no erythema or swelling of external ear anatomy. She has a soft nodular lesion, flesh colored, on posterior aspect of EAC in it's proximal portion.  This is very tender to touch.  No drainage or "head" noted.  TM normal.  REmainder of EAC normal.  IMPRESSION AND PLAN:  Epidermal inclusion cyst vs small skin abscess. I used an 3518 G needle to open this lesion today, then used long Q tip to express white, thick cystic fluid + some serosanguinous fluid from the lesion.  The lesion shrunk in size and was much less tender to touch after procedure.  PT tolerated procedure well, no immediate complications.  Bactroban ointment to area with Q tip tid. Also, use Q tip to apply pressure and express contents if they begin to build up again.  Warm compress encouraged.  Flu vaccine IM today.  An After Visit Summary was printed and given to the patient.  FOLLOW UP: prn

## 2013-12-30 NOTE — Progress Notes (Signed)
Pre visit review using our clinic review tool, if applicable. No additional management support is needed unless otherwise documented below in the visit note. 

## 2014-07-03 ENCOUNTER — Encounter: Payer: Self-pay | Admitting: Nurse Practitioner

## 2014-07-03 ENCOUNTER — Ambulatory Visit (INDEPENDENT_AMBULATORY_CARE_PROVIDER_SITE_OTHER): Payer: BC Managed Care – PPO | Admitting: Nurse Practitioner

## 2014-07-03 ENCOUNTER — Ambulatory Visit (HOSPITAL_BASED_OUTPATIENT_CLINIC_OR_DEPARTMENT_OTHER)
Admission: RE | Admit: 2014-07-03 | Discharge: 2014-07-03 | Disposition: A | Discharge status: Home / Self Care | Payer: BC Managed Care – PPO | Source: Ambulatory Visit | Attending: Nurse Practitioner | Admitting: Nurse Practitioner

## 2014-07-03 VITALS — BP 116/78 | HR 98 | Temp 98.0°F | Ht 62.0 in | Wt 195.0 lb

## 2014-07-03 DIAGNOSIS — R2242 Localized swelling, mass and lump, left lower limb: Secondary | ICD-10-CM | POA: Diagnosis not present

## 2014-07-03 LAB — PROTIME-INR
INR: 1 ratio (ref 0.8–1.0)
PROTHROMBIN TIME: 10.7 s (ref 9.6–13.1)

## 2014-07-03 LAB — CBC WITH DIFFERENTIAL/PLATELET
BASOS ABS: 0 10*3/uL (ref 0.0–0.1)
Basophils Relative: 0.3 % (ref 0.0–3.0)
EOS ABS: 0.2 10*3/uL (ref 0.0–0.7)
Eosinophils Relative: 1.8 % (ref 0.0–5.0)
HCT: 41.5 % (ref 36.0–46.0)
Hemoglobin: 14.4 g/dL (ref 12.0–15.0)
LYMPHS PCT: 30.9 % (ref 12.0–46.0)
Lymphs Abs: 3.2 10*3/uL (ref 0.7–4.0)
MCHC: 34.7 g/dL (ref 30.0–36.0)
MCV: 87.4 fl (ref 78.0–100.0)
MONO ABS: 0.5 10*3/uL (ref 0.1–1.0)
Monocytes Relative: 4.9 % (ref 3.0–12.0)
NEUTROS ABS: 6.4 10*3/uL (ref 1.4–7.7)
NEUTROS PCT: 62.1 % (ref 43.0–77.0)
Platelets: 275 10*3/uL (ref 150.0–400.0)
RBC: 4.74 Mil/uL (ref 3.87–5.11)
RDW: 12.9 % (ref 11.5–15.5)
WBC: 10.3 10*3/uL (ref 4.0–10.5)

## 2014-07-03 LAB — COMPREHENSIVE METABOLIC PANEL
ALT: 13 U/L (ref 0–35)
AST: 15 U/L (ref 0–37)
Albumin: 4.3 g/dL (ref 3.5–5.2)
Alkaline Phosphatase: 100 U/L (ref 39–117)
BILIRUBIN TOTAL: 0.7 mg/dL (ref 0.2–1.2)
BUN: 11 mg/dL (ref 6–23)
CALCIUM: 9.4 mg/dL (ref 8.4–10.5)
CHLORIDE: 104 meq/L (ref 96–112)
CO2: 29 mEq/L (ref 19–32)
CREATININE: 0.78 mg/dL (ref 0.40–1.20)
GFR: 93.16 mL/min (ref >60.00)
Glucose, Bld: 114 mg/dL — ABNORMAL HIGH (ref 70–99)
Potassium: 4 mEq/L (ref 3.5–5.1)
Sodium: 139 mEq/L (ref 135–145)
Total Protein: 7.3 g/dL (ref 6.0–8.3)

## 2014-07-03 LAB — SEDIMENTATION RATE: Sed Rate: 13 mm/hr (ref 0–22)

## 2014-07-03 NOTE — Progress Notes (Signed)
Pre visit review using our clinic review tool, if applicable. No additional management support is needed unless otherwise documented below in the visit note. 

## 2014-07-03 NOTE — Progress Notes (Signed)
Subjective:     Leslie Beck is a 29 y.o. female presents w/painful "knot"in l upper thigh. Onset 2 dys ago. Skin is red. No warmth. LE feels tight. "knot" is smaller today than yesterday. Denies insect bite.  The following portions of the patient's history were reviewed and updated as appropriate: allergies, current medications, past medical history, past social history, past surgical history and problem list.  Review of Systems Pertinent items are noted in HPI.    Objective:    BP 116/78 mmHg  Pulse 98  Temp(Src) 98 F (36.7 C) (Oral)  Ht 5' 2" (1.575 m)  Wt 195 lb (88.451 kg)  BMI 35.66 kg/m2  SpO2 100%  LMP 07/01/2014 BP 116/78 mmHg  Pulse 98  Temp(Src) 98 F (36.7 C) (Oral)  Ht 5' 2" (1.575 m)  Wt 195 lb (88.451 kg)  BMI 35.66 kg/m2  SpO2 100%  LMP 07/01/2014 General appearance: alert, cooperative, appears stated age and no distress Head: Normocephalic, without obvious abnormality, atraumatic Eyes: negative findings: lids and lashes normal and conjunctivae and sclerae normal Extremities: L upper thigh: marble sized lump, tender to touch, erythema adjacent to lump. No warmth. significant psoriasis arch of foot, o/w nml. Skin: Skin color, texture, turgor normal. No rashes or lesions Neurologic: Grossly normal  Resp: no resp distress.  Assessment:Plan   1. Lump of thigh, left DD: DVT, superficial clot, insect bite - US Venous Img Lower Unilateral Left; Future - CBC with Differential/Platelet - Comprehensive metabolic panel - Sedimentation rate - D-dimer, quantitative - Protime-INR  Ret 1 week

## 2014-07-03 NOTE — Patient Instructions (Signed)
I do not know if this is blood clot, if it is, it is likely superficial and does not pose harm.  You may apply heat to leg for 10 minutes several times daily. OK to take 81 mg enteric coated aspirin for 30 days.  Please get venous doppler of L legfor further evaluation. I will call with results & follow up.

## 2014-07-04 ENCOUNTER — Telehealth: Payer: Self-pay | Admitting: Nurse Practitioner

## 2014-07-04 LAB — D-DIMER, QUANTITATIVE (NOT AT ARMC): D DIMER QUANT: 0.47 ug{FEU}/mL (ref 0.00–0.48)

## 2014-07-04 NOTE — Telephone Encounter (Signed)
Labs do not indicate inflammation or clotting concerns. Venoud doppler r/o DVT, nodule is possibly fat necrosis. Discussed w/pt. She does not recALL trauma to area.  May use warm compress, but no intervention necessary.  She will call if further concerns.

## 2014-10-21 ENCOUNTER — Other Ambulatory Visit: Payer: Self-pay | Admitting: Obstetrics & Gynecology

## 2014-11-13 ENCOUNTER — Other Ambulatory Visit: Payer: Self-pay | Admitting: Obstetrics and Gynecology

## 2014-11-14 ENCOUNTER — Encounter: Payer: Self-pay | Admitting: Family Medicine

## 2014-11-18 LAB — CYTOLOGY - PAP

## 2015-02-15 DIAGNOSIS — K589 Irritable bowel syndrome without diarrhea: Secondary | ICD-10-CM

## 2015-02-15 HISTORY — DX: Irritable bowel syndrome, unspecified: K58.9

## 2015-03-27 ENCOUNTER — Emergency Department (HOSPITAL_BASED_OUTPATIENT_CLINIC_OR_DEPARTMENT_OTHER)
Admission: EM | Admit: 2015-03-27 | Discharge: 2015-03-27 | Disposition: A | Discharge status: Home / Self Care | Payer: BC Managed Care – PPO | Attending: Emergency Medicine | Admitting: Emergency Medicine

## 2015-03-27 ENCOUNTER — Encounter (HOSPITAL_BASED_OUTPATIENT_CLINIC_OR_DEPARTMENT_OTHER): Payer: Self-pay | Admitting: *Deleted

## 2015-03-27 DIAGNOSIS — Z3202 Encounter for pregnancy test, result negative: Secondary | ICD-10-CM | POA: Insufficient documentation

## 2015-03-27 DIAGNOSIS — E669 Obesity, unspecified: Secondary | ICD-10-CM | POA: Diagnosis not present

## 2015-03-27 DIAGNOSIS — R000 Tachycardia, unspecified: Secondary | ICD-10-CM | POA: Insufficient documentation

## 2015-03-27 DIAGNOSIS — Z8719 Personal history of other diseases of the digestive system: Secondary | ICD-10-CM | POA: Insufficient documentation

## 2015-03-27 DIAGNOSIS — Z87440 Personal history of urinary (tract) infections: Secondary | ICD-10-CM | POA: Insufficient documentation

## 2015-03-27 DIAGNOSIS — R509 Fever, unspecified: Secondary | ICD-10-CM

## 2015-03-27 DIAGNOSIS — J0100 Acute maxillary sinusitis, unspecified: Secondary | ICD-10-CM | POA: Insufficient documentation

## 2015-03-27 DIAGNOSIS — J45909 Unspecified asthma, uncomplicated: Secondary | ICD-10-CM | POA: Diagnosis not present

## 2015-03-27 DIAGNOSIS — Z872 Personal history of diseases of the skin and subcutaneous tissue: Secondary | ICD-10-CM | POA: Insufficient documentation

## 2015-03-27 LAB — PREGNANCY, URINE: PREG TEST UR: NEGATIVE

## 2015-03-27 LAB — MONONUCLEOSIS SCREEN: MONO SCREEN: NEGATIVE

## 2015-03-27 LAB — BASIC METABOLIC PANEL
ANION GAP: 12 (ref 5–15)
BUN: 9 mg/dL (ref 6–20)
CALCIUM: 8.8 mg/dL — AB (ref 8.9–10.3)
CO2: 20 mmol/L — ABNORMAL LOW (ref 22–32)
Chloride: 105 mmol/L (ref 101–111)
Creatinine, Ser: 0.65 mg/dL (ref 0.44–1.00)
Glucose, Bld: 138 mg/dL — ABNORMAL HIGH (ref 65–99)
Potassium: 3.6 mmol/L (ref 3.5–5.1)
Sodium: 137 mmol/L (ref 135–145)

## 2015-03-27 MED ORDER — AMOXICILLIN 500 MG PO CAPS
1000.0000 mg | ORAL_CAPSULE | Freq: Once | ORAL | Status: AC
  Administered 2015-03-27: 1000 mg via ORAL
  Filled 2015-03-27: qty 2

## 2015-03-27 MED ORDER — FLUCONAZOLE 150 MG PO TABS: 150.0000 mg | ORAL_TABLET | Freq: Once | ORAL | Status: DC

## 2015-03-27 MED ORDER — SODIUM CHLORIDE 0.9 % IV BOLUS (SEPSIS)
1000.0000 mL | Freq: Once | INTRAVENOUS | Status: AC
  Administered 2015-03-27: 1000 mL via INTRAVENOUS

## 2015-03-27 MED ORDER — AMOXICILLIN 500 MG PO CAPS: 1000.0000 mg | ORAL_CAPSULE | Freq: Two times a day (BID) | ORAL | Status: DC

## 2015-03-27 NOTE — ED Notes (Signed)
Pt referred from urgent care d/t rapid heart rate with fever.  Pt has had ear and throat pain and congestion for the last several days, developing low grade fever today.  She works at an [employer] and strep throat is going around so she went to urgent care to get tested.  At that time she had a fever of 100.8 and tachycardia so urgent care medicated with tylenol and sent her to the ED.  Pt denies any chest pain, no shortness of breath, comfortable currently and no acute distress noted.

## 2015-03-27 NOTE — ED Notes (Signed)
Pt verbalizes understanding of d/c instructions and denies any further needs at this time. 

## 2015-03-27 NOTE — Discharge Instructions (Signed)
Fever, Adult A fever is an increase in the body's temperature. It is often defined as a temperature of 100 F (38C) or higher. Short mild or moderate fevers often have no long-term effects. They also often do not need treatment. Moderate or high fevers may make you feel uncomfortable. Sometimes, they can also be a sign of a serious illness or disease. The sweating that may happen with repeated fevers or fevers that last a while may also cause you to not have enough fluid in your body (dehydration). You can take your temperature with a thermometer to see if you have a fever. A measured temperature can change with:  Age.  Time of day.  Where the thermometer is placed:  Mouth (oral).  Rectum (rectal).  Ear (tympanic).  Underarm (axillary).  Forehead (temporal). HOME CARE Pay attention to any changes in your symptoms. Take these actions to help with your condition:  Take over-the-counter and prescription medicines only as told by your doctor. Follow the dosing instructions carefully.  If you were prescribed an antibiotic medicine, take it as told by your doctor. Do not stop taking the antibiotic even if you start to feel better.  Rest as needed.  Drink enough fluid to keep your pee (urine) clear or pale yellow.  Sponge yourself or bathe with room-temperature water as needed. This helps to lower your body temperature . Do not use ice water.  Do not wear too many blankets or heavy clothes. GET HELP IF:  You throw up (vomit).  You cannot eat or drink without throwing up.  You have watery poop (diarrhea).  It hurts when you pee.  Your symptoms do not get better with treatment.  You have new symptoms.  You feel very weak. GET HELP RIGHT AWAY IF:  You are short of breath or have trouble breathing.  You are dizzy or you pass out (faint).  You feel confused.  You have signs of not having enough fluid in your body, such as:  A dry mouth.  Peeing less.  Looking  pale.  You have very bad pain in your belly (abdomen).  You keep throwing up or having water poop.  You have a skin rash.  Your symptoms suddenly get worse.   This information is not intended to replace advice given to you by your health care provider. Make sure you discuss any questions you have with your health care provider.   Document Released: 11/10/2007 Document Revised: 10/22/2014 Document Reviewed: 03/27/2014 Elsevier Interactive Patient Education 2016 [employer] Sinusitis, Adult Sinusitis is redness, soreness, and inflammation of the paranasal sinuses. Paranasal sinuses are air pockets within the bones of your face. They are located beneath your eyes, in the middle of your forehead, and above your eyes. In healthy paranasal sinuses, mucus is able to drain out, and air is able to circulate through them by way of your nose. However, when your paranasal sinuses are inflamed, mucus and air can become trapped. This can allow bacteria and other germs to grow and cause infection. Sinusitis can develop quickly and last only a short time (acute) or continue over a long period (chronic). Sinusitis that lasts for more than 12 weeks is considered chronic. CAUSES Causes of sinusitis include:  Allergies.  Structural abnormalities, such as displacement of the cartilage that separates your nostrils (deviated septum), which can decrease the air flow through your nose and sinuses and affect sinus drainage.  Functional abnormalities, such as when the small hairs (cilia) that line your sinuses and  help remove mucus do not work properly or are not present. SIGNS AND SYMPTOMS Symptoms of acute and chronic sinusitis are the same. The primary symptoms are pain and pressure around the affected sinuses. Other symptoms include:  Upper toothache.  Earache.  Headache.  Bad breath.  Decreased sense of smell and taste.  A cough, which worsens when you are lying  flat.  Fatigue.  Fever.  Thick drainage from your nose, which often is green and may contain pus (purulent).  Swelling and warmth over the affected sinuses. DIAGNOSIS Your health care provider will perform a physical exam. During your exam, your health care provider may perform any of the following to help determine if you have acute sinusitis or chronic sinusitis:  Look in your nose for signs of abnormal growths in your nostrils (nasal polyps).  Tap over the affected sinus to check for signs of infection.  View the inside of your sinuses using an imaging device that has a light attached (endoscope). If your health care provider suspects that you have chronic sinusitis, one or more of the following tests may be recommended:  Allergy tests.  Nasal culture. A sample of mucus is taken from your nose, sent to a lab, and screened for bacteria.  Nasal cytology. A sample of mucus is taken from your nose and examined by your health care provider to determine if your sinusitis is related to an allergy. TREATMENT Most cases of acute sinusitis are related to a viral infection and will resolve on their own within 10 days. Sometimes, medicines are prescribed to help relieve symptoms of both acute and chronic sinusitis. These may include pain medicines, decongestants, nasal steroid sprays, or saline sprays. However, for sinusitis related to a bacterial infection, your health care provider will prescribe antibiotic medicines. These are medicines that will help kill the bacteria causing the infection. Rarely, sinusitis is caused by a fungal infection. In these cases, your health care provider will prescribe antifungal medicine. For some cases of chronic sinusitis, surgery is needed. Generally, these are cases in which sinusitis recurs more than 3 times per year, despite other treatments. HOME CARE INSTRUCTIONS  Drink plenty of water. Water helps thin the mucus so your sinuses can drain more  easily.  Use a humidifier.  Inhale steam 3-4 times a day (for example, sit in the bathroom with the shower running).  Apply a warm, moist washcloth to your face 3-4 times a day, or as directed by your health care provider.  Use saline nasal sprays to help moisten and clean your sinuses.  Take medicines only as directed by your health care provider.  If you were prescribed either an antibiotic or antifungal medicine, finish it all even if you start to feel better. SEEK IMMEDIATE MEDICAL CARE IF:  You have increasing pain or severe headaches.  You have nausea, vomiting, or drowsiness.  You have swelling around your face.  You have vision problems.  You have a stiff neck.  You have difficulty breathing.   This information is not intended to replace advice given to you by your health care provider. Make sure you discuss any questions you have with your health care provider.   Document Released: 01/31/2005 Document Revised: 02/21/2014 Document Reviewed: 02/15/2011 Elsevier Interactive Patient Education [employer]

## 2015-03-27 NOTE — ED Provider Notes (Signed)
CSN: [id number]     Arrival date & time 03/27/15  2003 History   First MD Initiated Contact with Patient 03/27/15 2013     Chief Complaint  Patient presents with  . Fever     (Consider location/radiation/quality/duration/timing/severity/associated sxs/prior Treatment) HPI Comments: Patient presents with husband for evaluation of fever. She was seen at Urgent Care prior to arrival and reports negative strep and flu testing. She was sent here for further evaluation because her heart rate was significantly elevated.She denies chest pain or palpitations. No vomiting. She has had sore throat and minimal nasal congestions. 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   Patient is a 30 y.o. female presenting with fever. The history is provided by the patient and the spouse. No language interpreter was used.  Fever Associated symptoms: sore throat   Associated symptoms: no chest pain, no cough, no diarrhea, no dysuria, no headaches, no myalgias, no nausea and no rash     Past Medical History  Diagnosis Date  . ALLERGIC RHINITIS   . Leg pain     Bilat.  No DVT  . Psoriasis     vs dyshidrotic dermatitis per latest eval by Dr. Terri Piedra 08/15/13  . GERD (gastroesophageal reflux disease)     not on daily PPI anymore  . Recurrent UTI 2012/13  . Asthma   . Obesity, Class II, BMI 35-39.9    Past Surgical History  Procedure Laterality Date  . No past surgeries     Family History  Problem Relation Age of Onset  . Hypertension Mother   . Hypothyroidism Mother   . Other Mother     varicose veins  . Hypertension Father   . Cancer Maternal Grandfather     colon cancer  . Cancer Paternal Grandmother     colon cancer   Social History  Substance Use Topics  . Smoking status: Never Smoker   . Smokeless tobacco: Never Used  . Alcohol Use: No   OB History    Gravida Para Term Preterm AB TAB SAB Ectopic Multiple Living   Review of Systems  Constitutional: Positive for fever.  HENT: Positive for sore throat.   Respiratory: Negative for cough.   Cardiovascular: Negative for chest pain.  Gastrointestinal: Negative for nausea, abdominal pain and diarrhea.  Genitourinary: Negative for dysuria.  Musculoskeletal: Negative for myalgias, neck pain and neck stiffness.  Skin: Negative for rash.  Neurological: Negative for headaches.  All other systems reviewed and are negative.     Allergies  Zithromax  Home Medications   Prior to Admission medications   Medication Sig Start Date End Date Taking? Authorizing Provider  clobetasol ointment (TEMOVATE) 0.05 % Apply 1 application topically at bedtime as needed (psoriasis). 02/21/13   Kelle Darting, NP   BP 130/82 mmHg  Pulse 118  Temp(Src) 99 F (37.2 C) (Oral)  Resp 18  Ht  (1.575 m)  Wt 86.183 kg  BMI 34.74 kg/m2  SpO2 98%  LMP 03/12/2015 Physical Exam  Constitutional: She is oriented to person, place, and time. She appears well-developed and well-nourished.  HENT:  Head: Normocephalic.  Oropharynx slightly erythematous. No exudates.  Eyes: Conjunctivae are normal.  Neck: Normal range of motion. Neck supple.  Cardiovascular: Regular rhythm.  Tachycardia present.   No murmur heard. Pulmonary/Chest: Effort normal and breath sounds normal. She has no  wheezes. She has no rales.  Abdominal: Soft. Bowel sounds are normal. There is no tenderness. There is no rebound and no guarding.  Musculoskeletal: Normal range of motion.  Lymphadenopathy:    She has no cervical adenopathy.  Neurological: She is alert and oriented to person, place, and time.  Skin: Skin is warm and dry. No rash noted.  Psychiatric: She has a normal mood and affect.    ED Course  Procedures (including critical care time) Labs Review Labs Reviewed  BASIC METABOLIC PANEL  PREGNANCY, URINE   Results for orders placed or performed during the hospital encounter of 03/27/15   Basic metabolic panel  Result Value Ref Range   Sodium 137 135 - 145 mmol/L   Potassium 3.6 3.5 - 5.1 mmol/L   Chloride 105 101 - 111 mmol/L   CO2 20 (L) 22 - 32 mmol/L   Glucose, Bld 138 (H) 65 - 99 mg/dL   BUN 9 6 - 20 mg/dL   Creatinine, Ser 1.61 0.44 - 1.00 mg/dL   Calcium 8.8 (L) 8.9 - 10.3 mg/dL   GFR calc non Af Amer >60 >60 mL/min   GFR calc Af Amer >60 >60 mL/min   Anion gap 12 5 - 15  Pregnancy, urine  Result Value Ref Range   Preg Test, Ur NEGATIVE NEGATIVE  Mononucleosis screen  Result Value Ref Range   Mono Screen NEGATIVE NEGATIVE    Imaging Review No results found. I have personally reviewed and evaluated these images and lab results as part of my medical decision-making.   EKG Interpretation   Date/Time:  Friday March 27 2015 21:24:42 EST Ventricular Rate:  117 PR Interval:  121 QRS Duration: 88 QT Interval:  280 QTC Calculation: 390 R Axis:   9 Text Interpretation:  Sinus tachycardia Probable left atrial enlargement  Probable LVH with secondary repol abnrm No previous ECGs available  Confirmed by Manus Gunning  MD, STEPHEN [phone]) on 03/27/2015 9:32:50 PM      MDM   Final diagnoses:  None    1. Febrile illness  IVF provided with improvement in heart rate. The patient appears comfortable, non-toxic, no complaints. Negative lab studies. Feel she can be discharged home with primary care follow up for persistent symptoms.     Elpidio Anis, PA-C 04/02/15 [id number]  Elpidio Anis, PA-C 04/14/15 2015  Glynn Octave, MD 04/15/15 [phone]

## 2015-03-27 NOTE — ED Notes (Signed)
Pt placed on auto vitals Q30.  

## 2015-03-27 NOTE — ED Notes (Signed)
Fever, sore throat ear pain for 10 days. Last dose of Tylenol was 6 pm.

## 2015-04-01 ENCOUNTER — Ambulatory Visit (INDEPENDENT_AMBULATORY_CARE_PROVIDER_SITE_OTHER): Payer: BC Managed Care – PPO | Admitting: Family Medicine

## 2015-04-01 ENCOUNTER — Encounter: Payer: Self-pay | Admitting: Family Medicine

## 2015-04-01 VITALS — BP 130/89 | HR 80 | Temp 98.4°F | Resp 20 | Wt 192.5 lb

## 2015-04-01 DIAGNOSIS — L409 Psoriasis, unspecified: Secondary | ICD-10-CM

## 2015-04-01 DIAGNOSIS — J0100 Acute maxillary sinusitis, unspecified: Secondary | ICD-10-CM | POA: Diagnosis not present

## 2015-04-01 DIAGNOSIS — K219 Gastro-esophageal reflux disease without esophagitis: Secondary | ICD-10-CM | POA: Insufficient documentation

## 2015-04-01 MED ORDER — DOXYCYCLINE HYCLATE 100 MG PO TABS: 100.0000 mg | ORAL_TABLET | Freq: Two times a day (BID) | ORAL | Status: DC

## 2015-04-01 MED ORDER — CLOBETASOL PROPIONATE 0.05 % EX OINT: 1.0000 | TOPICAL_OINTMENT | Freq: Every evening | CUTANEOUS | Status: DC | PRN

## 2015-04-01 NOTE — Progress Notes (Signed)
Patient ID: Leslie Beck, female   DOB: 06/05/1985, 30 y.o.   MRN: [id number]    Leslie Beck , [birth year], 30 y.o., female MRN: [id number]  CC: Rash Subjective: Pt presents for an acute OV with complaints of rash of 1 day duration, that started this morning.Associated symptoms include itchiness across chest and stomach. Patient states that she had a hive-like rash across her chest and abdomen this morning upon awakening, that went away within an hour without any treatment. Patient was started on amoxicillin approximately 5 days ago for a sinus infection, and was uncertain if the reaction came from the medication. She states she has taken amoxicillin in the past without difficulty. She has had a similar reaction to a Z-Pak when she was a young child. Patient denies any facial swelling, lip/mouth involvement or shortness of breath. Patient states the rash is completely resolved now.  GERD: Patient states she's noticed some reflux like symptoms. She is having retrosternal burning in her chest intermittently. She is currently not having any symptoms. She states she has tried taking over-the-counter Zantac, and that did make her symptoms better, however she stopped taking the medication. She does not follow a GERD diet. She is uncertain if it is triggered by certain types of foods. She is a nonsmoker. She does not routinely use NSAIDs.  Psoariasis: Patient states with her recent illness, and possibly some dehydration she feels her psoriasis is worsening. She has a dermatology appointment in 2 weeks but she is out of her prescribed ointment. She is requesting refills today.   Allergies  Allergen Reactions  . Zithromax [Azithromycin] Rash   Social History  Substance Use Topics  . Smoking status: Never Smoker   . Smokeless tobacco: Never Used  . Alcohol Use: No   Past Medical History  Diagnosis Date  . ALLERGIC RHINITIS   . Leg pain     Bilat.  No DVT  . Psoriasis     vs dyshidrotic  dermatitis per latest eval by Dr. Terri Piedra 08/15/13  . GERD (gastroesophageal reflux disease)     not on daily PPI anymore  . Recurrent UTI 2012/13  . Asthma   . Obesity, Class II, BMI 35-39.9    Past Surgical History  Procedure Laterality Date  . No past surgeries     Family History  Problem Relation Age of Onset  . Hypertension Mother   . Hypothyroidism Mother   . Other Mother     varicose veins  . Hypertension Father   . Cancer Maternal Grandfather     colon cancer  . Cancer Paternal Grandmother     colon cancer     Medication List       This list is accurate as of: 04/01/15 11:12 AM.  Always use your most recent med list.               amoxicillin 500 MG capsule  Commonly known as:  AMOXIL  Take 2 capsules (1,000 mg total) by mouth 2 (two) times daily.     clobetasol ointment 0.05 %  Commonly known as:  TEMOVATE  Apply 1 application topically at bedtime as needed (psoriasis).     fluconazole 150 MG tablet  Commonly known as:  DIFLUCAN  Take 1 tablet (150 mg total) by mouth once.       ROS: Negative, with the exception of above mentioned in HPI  Objective:  BP 130/89 mmHg  Pulse 80  Temp(Src) 98.4 F (36.9 C)  Resp 20  Wt  192 lb 8 oz (87.317 kg)  SpO2 100%  LMP 03/12/2015 Body mass index is 35.2 kg/(m^2). Gen: Afebrile. No acute distress. Nontoxic in appearance, well-developed, well-nourished, Caucasian female.  HENT: AT. [place]. Bilateral TM visualized and normal in appearance. MMM, no oral lesions. Bilateral nares erythema, no swelling. Throat without erythema or exudates. No cough on exam, no hoarseness on exam. Mild tenderness to palpation maxillary sinuses remains. Eyes:Pupils Equal Round Reactive to light, Extraocular movements intact,  Conjunctiva without redness, discharge or icterus. Neck/lymp/endocrine: Supple, mild anterior cervical lymphadenopathy CV: RRR  Chest: CTAB, no wheeze or crackles. Good air movement, normal resp effort.  Abd: Soft.  NTND. BS present Skin: No rashes, purpura or petechiae. Near bruising from patient scratching chest Neuro: Normal gait. PERLA. EOMi. Alert. Oriented x3  Assessment/Plan: Leslie Beck is a 30 y.o. female present for acute OV for  1. Acute maxillary sinusitis, recurrence not specified - Patient to discontinue Amoxil, don't feel certain rash/hives was coming from the amoxicillin. However to be safe will finish her antibiotic course with doxycycline. - doxycycline (VIBRA-TABS) 100 MG tablet; Take 1 tablet (100 mg total) by mouth 2 (two) times daily.  Dispense: 10 tablet; Refill: 0  2. Psoriasis - refilled ointment today. Patient encouraged to use sparingly, and picture to moisturize skin daily, especially after shower. - clobetasol ointment (TEMOVATE) 0.05 %; Apply 1 application topically at bedtime as needed (psoriasis).  Dispense: 30 g; Refill: 1  3. Gastroesophageal reflux disease, esophagitis presence not specified - Zantac BID for 4 weeks and GERD diet   Leslie Wallis, DO  [place]

## 2015-04-01 NOTE — Patient Instructions (Signed)
Start zantac 150 mg BID (over the counter) and follow GERD diet, if not improved in 4 weeks please be seen. Doxycycline called in for 5 days (discontinue amoxil)  If you get a rash consider benadryl use. If on face or affecting breathing be seen immediately.   Gastroesophageal Reflux Disease, Adult Normally, food travels down the esophagus and stays in the stomach to be digested. However, when a person has gastroesophageal reflux disease (GERD), food and stomach acid move back up into the esophagus. When this happens, the esophagus becomes sore and inflamed. Over time, GERD can create small holes (ulcers) in the lining of the esophagus.  CAUSES This condition is caused by a problem with the muscle between the esophagus and the stomach (lower esophageal sphincter, or LES). Normally, the LES muscle closes after food passes through the esophagus to the stomach. When the LES is weakened or abnormal, it does not close properly, and that allows food and stomach acid to go back up into the esophagus. The LES can be weakened by certain dietary substances, medicines, and medical conditions, including:  Tobacco use.  Pregnancy.  Having a hiatal hernia.  Heavy alcohol use.  Certain foods and beverages, such as coffee, chocolate, onions, and peppermint. RISK FACTORS This condition is more likely to develop in:  People who have an increased body weight.  People who have connective tissue disorders.  People who use NSAID medicines. SYMPTOMS Symptoms of this condition include:  Heartburn.  Difficult or painful swallowing.  The feeling of having a lump in the throat.  Abitter taste in the mouth.  Bad breath.  Having a large amount of saliva.  Having an upset or bloated stomach.  Belching.  Chest pain.  Shortness of breath or wheezing.  Ongoing (chronic) cough or a night-time cough.  Wearing away of tooth enamel.  Weight loss. Different conditions can cause chest pain. Make sure  to see your health care provider if you experience chest pain. DIAGNOSIS Your health care provider will take a medical history and perform a physical exam. To determine if you have mild or severe GERD, your health care provider may also monitor how you respond to treatment. You may also have other tests, including:  An endoscopy toexamine your stomach and esophagus with a small camera.  A test thatmeasures the acidity level in your esophagus.  A test thatmeasures how much pressure is on your esophagus.  A barium swallow or modified barium swallow to show the shape, size, and functioning of your esophagus. TREATMENT The goal of treatment is to help relieve your symptoms and to prevent complications. Treatment for this condition may vary depending on how severe your symptoms are. Your health care provider may recommend:  Changes to your diet.  Medicine.  Surgery. HOME CARE INSTRUCTIONS Diet  Follow a diet as recommended by your health care provider. This may involve avoiding foods and drinks such as:  Coffee and tea (with or without caffeine).  Drinks that containalcohol.  Energy drinks and sports drinks.  Carbonated drinks or sodas.  Chocolate and cocoa.  Peppermint and mint flavorings.  Garlic and onions.  Horseradish.  Spicy and acidic foods, including peppers, chili powder, curry powder, vinegar, hot sauces, and barbecue sauce.  Citrus fruit juices and citrus fruits, such as oranges, lemons, and limes.  Tomato-based foods, such as red sauce, chili, salsa, and pizza with red sauce.  Fried and fatty foods, such as donuts, french fries, potato chips, and high-fat dressings.  High-fat meats, such as  hot dogs and fatty cuts of red and white meats, such as rib eye steak, sausage, ham, and bacon.  High-fat dairy items, such as whole milk, butter, and cream cheese.  Eat small, frequent meals instead of large meals.  Avoid drinking large amounts of liquid with your  meals.  Avoid eating meals during the 2-3 hours before bedtime.  Avoid lying down right after you eat.  Do not exercise right after you eat. General Instructions  Pay attention to any changes in your symptoms.  Take over-the-counter and prescription medicines only as told by your health care provider. Do not take aspirin, ibuprofen, or other NSAIDs unless your health care provider told you to do so.  Do not use any tobacco products, including cigarettes, chewing tobacco, and e-cigarettes. If you need help quitting, ask your health care provider.  Wear loose-fitting clothing. Do not wear anything tight around your waist that causes pressure on your abdomen.  Raise (elevate) the head of your bed 6 inches (15cm).  Try to reduce your stress, such as with yoga or meditation. If you need help reducing stress, ask your health care provider.  If you are overweight, reduce your weight to an amount that is healthy for you. Ask your health care provider for guidance about a safe weight loss goal.  Keep all follow-up visits as told by your health care provider. This is important. SEEK MEDICAL CARE IF:  You have new symptoms.  You have unexplained weight loss.  You have difficulty swallowing, or it hurts to swallow.  You have wheezing or a persistent cough.  Your symptoms do not improve with treatment.  You have a hoarse voice. SEEK IMMEDIATE MEDICAL CARE IF:  You have pain in your arms, neck, jaw, teeth, or back.  You feel sweaty, dizzy, or light-headed.  You have chest pain or shortness of breath.  You vomit and your vomit looks like blood or coffee grounds.  You faint.  Your stool is bloody or black.  You cannot swallow, drink, or eat.   This information is not intended to replace advice given to you by your health care provider. Make sure you discuss any questions you have with your health care provider.   Document Released: 11/10/2004 Document Revised: 10/22/2014  Document Reviewed: 05/28/2014 Elsevier Interactive Patient Education [employer]

## 2015-05-11 ENCOUNTER — Other Ambulatory Visit: Payer: Self-pay | Admitting: Obstetrics and Gynecology

## 2015-05-15 ENCOUNTER — Telehealth: Payer: Self-pay

## 2015-05-15 NOTE — Telephone Encounter (Signed)
Left vmail requesting return call about status of flu vaccine.

## 2015-06-18 ENCOUNTER — Ambulatory Visit (INDEPENDENT_AMBULATORY_CARE_PROVIDER_SITE_OTHER): Payer: BC Managed Care – PPO | Admitting: Family Medicine

## 2015-06-18 ENCOUNTER — Encounter: Payer: Self-pay | Admitting: Family Medicine

## 2015-06-18 VITALS — BP 123/82 | HR 84 | Temp 98.0°F | Resp 16 | Ht 62.0 in | Wt 183.2 lb

## 2015-06-18 DIAGNOSIS — K219 Gastro-esophageal reflux disease without esophagitis: Secondary | ICD-10-CM

## 2015-06-18 MED ORDER — OMEPRAZOLE 20 MG PO CPDR: 20.0000 mg | DELAYED_RELEASE_CAPSULE | Freq: Every day | ORAL | Status: DC

## 2015-06-18 NOTE — Progress Notes (Signed)
OFFICE VISIT  06/18/2015   CC:  Chief Complaint  Patient presents with  . Follow-up    GERD     HPI:    Patient is a 30 y.o. Caucasian female who presents for f/u GERD. She saw Dr. Claiborne BillingsKuneff a couple months ago and zantac bid + GERD diet was recommended. She took prilosec otc x 14 d and felt improved.  After a couple days of finishing this her sx's returned  Esp when she ate greasy or spicy foods.  She took prilosec again and sx's were once again well controlled.    No n/v or abd pain.   Past Medical History  Diagnosis Date  . ALLERGIC RHINITIS   . Leg pain     Bilat.  No DVT  . Psoriasis     vs dyshidrotic dermatitis per latest eval by Dr. Terri PiedraLupton 08/15/13  . GERD (gastroesophageal reflux disease)     not on daily PPI anymore  . Recurrent UTI 2012/13  . Asthma   . Obesity, Class II, BMI 35-39.9     Past Surgical History  Procedure Laterality Date  . No past surgeries      Outpatient Prescriptions Prior to Visit  Medication Sig Dispense Refill  . clobetasol ointment (TEMOVATE) 0.05 % Apply 1 application topically at bedtime as needed (psoriasis). 30 g 1  . doxycycline (VIBRA-TABS) 100 MG tablet Take 1 tablet (100 mg total) by mouth 2 (two) times daily. (Patient not taking: Reported on 06/18/2015) 10 tablet 0   No facility-administered medications prior to visit.    Allergies  Allergen Reactions  . Zithromax [Azithromycin] Rash    ROS As per HPI  PE: Blood pressure 123/82, pulse 84, temperature 98 F (36.7 C), temperature source Oral, resp. rate 16, height 5\' 2"  (1.575 m), weight 183 lb 4 oz (83.122 kg), last menstrual period 06/05/2015, SpO2 100 %. Gen: Alert, well appearing.  Patient is oriented to person, place, time, and situation. NGE:XBMWENT:Eyes: no injection, icteris, swelling, or exudate.  EOMI, PERRLA. Mouth: lips without lesion/swelling.  Oral mucosa pink and moist. Oropharynx without erythema, exudate, or swelling.  CV: RRR, no m/r/g.   LUNGS: CTA bilat,  nonlabored resps, good aeration in all lung fields. ABD: soft, NT, ND, BS normal.  No hepatospenomegaly or mass.  No bruits.  LABS:  none  IMPRESSION AND PLAN:  GERD, w/out esophagitis. Well controlled taking 20mg  prilosec daily. Continue this for 3-6 mo and [facility]. GERD diet emphasized.  An After Visit Summary was printed and given to the patient.  FOLLOW UP: No Follow-up on file.  .Signed:  Santiago BumpersPhil McGowen, MD           06/18/2015

## 2015-06-18 NOTE — Progress Notes (Signed)
Pre visit review using our clinic review tool, if applicable. No additional management support is needed unless otherwise documented below in the visit note. 

## 2015-08-14 ENCOUNTER — Encounter: Payer: Self-pay | Admitting: Family Medicine

## 2015-08-14 ENCOUNTER — Ambulatory Visit (INDEPENDENT_AMBULATORY_CARE_PROVIDER_SITE_OTHER): Payer: BC Managed Care – PPO | Admitting: Family Medicine

## 2015-08-14 VITALS — BP 125/88 | HR 100 | Temp 98.7°F | Resp 20 | Wt 181.8 lb

## 2015-08-14 DIAGNOSIS — R1013 Epigastric pain: Secondary | ICD-10-CM | POA: Diagnosis not present

## 2015-08-14 DIAGNOSIS — G8929 Other chronic pain: Secondary | ICD-10-CM

## 2015-08-14 DIAGNOSIS — R079 Chest pain, unspecified: Secondary | ICD-10-CM | POA: Insufficient documentation

## 2015-08-14 DIAGNOSIS — M79602 Pain in left arm: Secondary | ICD-10-CM

## 2015-08-14 MED ORDER — OMEPRAZOLE 20 MG PO CPDR: 20.0000 mg | DELAYED_RELEASE_CAPSULE | Freq: Two times a day (BID) | ORAL | Status: DC

## 2015-08-14 NOTE — Progress Notes (Signed)
Patient ID: Leslie Beck, female   DOB: 10/29/1985, 30 y.o.   MRN: [id number] OFFICE VISIT  08/14/2015   CC:  Chief Complaint  Patient presents with  . Gastroesophageal Reflux     HPI:    Patient is a 30 y.o. Caucasian female who presents with chest discomfort, left arm discomfort, epigastric discomfort.  She saw myself and Dr. Milinda CaveMcGowen for this issue. Initially started on zantac, and then followed up for worsening symptoms and started on omeprazole 20 mg. She states a few years ago she was on 40 mg of omeprazole because she had ulcers. She reports she was on vacation last week prior to onset of symptoms and eating late at night and not very well as far as following her GERD diet. She states she is uncertain if all of her symptoms are anxiety related. She felt her heart beating fast and chest felt heavy. She told her mother who stated it could be her heart, and then she remembered her left arm was bothering her. Today she had a loose stool.  She thought it was all from vacation/lifting but then started to be concerned it was her heart. Patient denies fever, chills, vomit, headache, myalgias, chest pain, jaw pain, syncope or shortness of breath. She describes the chest/arm as discomfort/"heaviness". No sick contacts.   Past Medical History  Diagnosis Date  . ALLERGIC RHINITIS   . Leg pain     Bilat.  No DVT  . Psoriasis     vs dyshidrotic dermatitis per latest eval by Dr. Terri PiedraLupton 08/15/13  . GERD (gastroesophageal reflux disease)     not on daily PPI anymore  . Recurrent UTI 2012/13  . Asthma   . Obesity, Class II, BMI 35-39.9     Past Surgical History  Procedure Laterality Date  . No past surgeries      Outpatient Prescriptions Prior to Visit  Medication Sig Dispense Refill  . clobetasol ointment (TEMOVATE) 0.05 % Apply 1 application topically at bedtime as needed (psoriasis). 30 g 1  . omeprazole (PRILOSEC) 20 MG capsule Take 1 capsule (20 mg total) by mouth daily. 30 capsule  11   No facility-administered medications prior to visit.    Allergies  Allergen Reactions  . Zithromax [Azithromycin] Rash    ROS As per HPI  PE: Blood pressure 125/88, pulse 100, temperature 98.7 F (37.1 C), resp. rate 20, weight 181 lb 12 oz (82.441 kg), SpO2 99 %. Gen: Alert, well appearing.  Patient is oriented to person, place, time, and situation. EENT:Eyes: no injection, icteris, swelling, or exudate.  EOMI, PERRLA. Bilateral nares without erythema or exudates. Bilateral TM with air fluid levels, no erythema or bulging. EAM normal.  Mouth: lips without lesion/swelling.  Oral mucosa pink and moist. Oropharynx without erythema, exudate, or swelling.  CV: mild tachycardia , no m/r/g.  No edema. LUNGS: CTA bilat, nonlabored resps, good aeration in all lung fields. ABD: soft, NT, ND, BS normal.  No hepatospenomegaly or mass.  No bruits. EKG: SR. HR 87, PR 132, Qtc 405. No ST changes. Consistent with prior ekg.   LABS:  none  IMPRESSION AND PLAN: GERD, w/out esophagitis. GERD diet emphasized, increased omeprazole to 40 mg daily for 4 weeks and then return to 20 mg daily. +/- zantac, depending on increasing PPI results.   Chest pain was reproducible on exam by palpation. Pt was mildly tachycardic. Performed EKG, mostly for reassurance. EKG normal. Chest/arm discomfort likely from lifting suitcase/child etc while on vacation.  Toradol IM  offered/declined. Encouraged to try to avoid NSAIDS orally if able with GERD symptoms. Can use tylenol if needed. Start light stretches. An After Visit Summary was printed and given to the patient. - F/U 4 weeks if not improving or sooner if worsening.   Greater than 40 minutes was spent with patient, greater than 50% of that time was spent face-to-face with patient counseling and coordinating care.   Electronically Signed by: Felix Pacinienee Kuneff, DO [facility]- [place]

## 2015-08-14 NOTE — Patient Instructions (Signed)
Food Choices for Gastroesophageal Reflux Disease, Adult When you have gastroesophageal reflux disease (GERD), the foods you eat and your eating habits are very important. Choosing the right foods can help ease the discomfort of GERD. WHAT GENERAL GUIDELINES DO I NEED TO FOLLOW?  Choose fruits, vegetables, whole grains, low-fat dairy products, and low-fat meat, fish, and poultry.  Limit fats such as oils, salad dressings, butter, nuts, and avocado.  Keep a food diary to identify foods that cause symptoms.  Avoid foods that cause reflux. These may be different for different people.  Eat frequent small meals instead of three large meals each day.  Eat your meals slowly, in a relaxed setting.  Limit fried foods.  Cook foods using methods other than frying.  Avoid drinking alcohol.  Avoid drinking large amounts of liquids with your meals.  Avoid bending over or lying down until 2-3 hours after eating. WHAT FOODS ARE NOT RECOMMENDED? The following are some foods and drinks that may worsen your symptoms: Vegetables Tomatoes. Tomato juice. Tomato and spaghetti sauce. Chili peppers. Onion and garlic. Horseradish. Fruits Oranges, grapefruit, and lemon (fruit and juice). Meats High-fat meats, fish, and poultry. This includes hot dogs, ribs, ham, sausage, salami, and bacon. Dairy Whole milk and chocolate milk. Sour cream. Cream. Butter. Ice cream. Cream cheese.  Beverages Coffee and tea, with or without caffeine. Carbonated beverages or energy drinks. Condiments Hot sauce. Barbecue sauce.  Sweets/Desserts Chocolate and cocoa. Donuts. Peppermint and spearmint. Fats and Oils High-fat foods, including [place] fries and potato chips. Other Vinegar. Strong spices, such as black pepper, white pepper, red pepper, cayenne, curry powder, cloves, ginger, and chili powder. The items listed above may not be a complete list of foods and beverages to avoid. Contact your dietitian for more  information.   This information is not intended to replace advice given to you by your health care provider. Make sure you discuss any questions you have with your health care provider.   Document Released: 01/31/2005 Document Revised: 02/21/2014 Document Reviewed: 12/05/2012 Elsevier Interactive Patient Education 2016 [place].  Gastroesophageal Reflux Disease, Adult Normally, food travels down the esophagus and stays in the stomach to be digested. However, when a person has gastroesophageal reflux disease (GERD), food and stomach acid move back up into the esophagus. When this happens, the esophagus becomes sore and inflamed. Over time, GERD can create small holes (ulcers) in the lining of the esophagus.  CAUSES This condition is caused by a problem with the muscle between the esophagus and the stomach (lower esophageal sphincter, or LES). Normally, the LES muscle closes after food passes through the esophagus to the stomach. When the LES is weakened or abnormal, it does not close properly, and that allows food and stomach acid to go back up into the esophagus. The LES can be weakened by certain dietary substances, medicines, and medical conditions, including:  Tobacco use.  Pregnancy.  Having a hiatal hernia.  Heavy alcohol use.  Certain foods and beverages, such as coffee, chocolate, onions, and peppermint. RISK FACTORS This condition is more likely to develop in:  People who have an increased body weight.  People who have connective tissue disorders.  People who use NSAID medicines. SYMPTOMS Symptoms of this condition include:  Heartburn.  Difficult or painful swallowing.  The feeling of having a lump in the throat.  Abitter taste in the mouth.  Bad breath.  Having a large amount of saliva.  Having an upset or bloated stomach.  Belching.  Chest pain.  Shortness of breath or wheezing. °· Ongoing (chronic) cough or a night-time cough. °· Wearing away of  tooth enamel. °· Weight loss. °Different conditions can cause chest pain. Make sure to see your health care provider if you experience chest pain. °DIAGNOSIS °Your health care provider will take a medical history and perform a physical exam. To determine if you have mild or severe GERD, your health care provider may also monitor how you respond to treatment. You may also have other tests, including: °· An endoscopy to examine your stomach and esophagus with a small camera. °· A test that measures the acidity level in your esophagus. °· A test that measures how much pressure is on your esophagus. °· A barium swallow or modified barium swallow to show the shape, size, and functioning of your esophagus. °TREATMENT °The goal of treatment is to help relieve your symptoms and to prevent complications. Treatment for this condition may vary depending on how severe your symptoms are. Your health care provider may recommend: °· Changes to your diet. °· Medicine. °· Surgery. °HOME CARE INSTRUCTIONS °Diet °· Follow a diet as recommended by your health care provider. This may involve avoiding foods and drinks such as: °¨ Coffee and tea (with or without caffeine). °¨ Drinks that contain alcohol. °¨ Energy drinks and sports drinks. °¨ Carbonated drinks or sodas. °¨ Chocolate and cocoa. °¨ Peppermint and mint flavorings. °¨ Garlic and onions. °¨ Horseradish. °¨ Spicy and acidic foods, including peppers, chili powder, curry powder, vinegar, hot sauces, and barbecue sauce. °¨ Citrus fruit juices and citrus fruits, such as oranges, lemons, and limes. °¨ Tomato-based foods, such as red sauce, chili, salsa, and pizza with red sauce. °¨ Fried and fatty foods, such as donuts, french fries, potato chips, and high-fat dressings. °¨ High-fat meats, such as hot dogs and fatty cuts of red and white meats, such as rib eye steak, sausage, ham, and bacon. °¨ High-fat dairy items, such as whole milk, butter, and cream cheese. °· Eat small,  frequent meals instead of large meals. °· Avoid drinking large amounts of liquid with your meals. °· Avoid eating meals during the 2-3 hours before bedtime. °· Avoid lying down right after you eat. °· Do not exercise right after you eat. ° General Instructions  °· Pay attention to any changes in your symptoms. °· Take over-the-counter and prescription medicines only as told by your health care provider. Do not take aspirin, ibuprofen, or other NSAIDs unless your health care provider told you to do so. °· Do not use any tobacco products, including cigarettes, chewing tobacco, and e-cigarettes. If you need help quitting, ask your health care provider. °· Wear loose-fitting clothing. Do not wear anything tight around your waist that causes pressure on your abdomen. °· Raise (elevate) the head of your bed 6 inches (15cm). °· Try to reduce your stress, such as with yoga or meditation. If you need help reducing stress, ask your health care provider. °· If you are overweight, reduce your weight to an amount that is healthy for you. Ask your health care provider for guidance about a safe weight loss goal. °· Keep all follow-up visits as told by your health care provider. This is important. °SEEK MEDICAL CARE IF: °· You have new symptoms. °· You have unexplained weight loss. °· You have difficulty swallowing, or it hurts to swallow. °· You have wheezing or a persistent cough. °· Your symptoms do not improve with treatment. °· You have a hoarse voice. °SEEK IMMEDIATE MEDICAL CARE IF: °· You have pain   in your arms, neck, jaw, teeth, or back.  You feel sweaty, dizzy, or light-headed.  You have chest pain or shortness of breath.  You vomit and your vomit looks like blood or coffee grounds.  You faint.  Your stool is bloody or black.  You cannot swallow, drink, or eat.   This information is not intended to replace advice given to you by your health care provider. Make sure you discuss any questions you have with  your health care provider.   Document Released: 11/10/2004 Document Revised: 10/22/2014 Document Reviewed: 05/28/2014 Elsevier Interactive Patient Education 2016 Elsevier Inc.   Take omeprazole 40 mg daily for 4 weeks, then taper back to 20 mg a day. Follow gerd diet. Try to avoid NSAIDS. Tylenol for chest and arm discomfort. Stretches. Heat therapy.

## 2015-10-22 ENCOUNTER — Other Ambulatory Visit: Payer: Self-pay | Admitting: Obstetrics and Gynecology

## 2015-11-16 ENCOUNTER — Other Ambulatory Visit: Payer: Self-pay | Admitting: Obstetrics & Gynecology

## 2015-12-11 ENCOUNTER — Other Ambulatory Visit: Payer: Self-pay | Admitting: Obstetrics and Gynecology

## 2015-12-14 LAB — CYTOLOGY - PAP

## 2015-12-21 ENCOUNTER — Encounter: Payer: Self-pay | Admitting: Internal Medicine

## 2015-12-23 ENCOUNTER — Encounter: Payer: Self-pay | Admitting: *Deleted

## 2015-12-24 ENCOUNTER — Encounter: Payer: Self-pay | Admitting: *Deleted

## 2016-01-12 ENCOUNTER — Encounter: Payer: Self-pay | Admitting: Internal Medicine

## 2016-01-12 ENCOUNTER — Ambulatory Visit (INDEPENDENT_AMBULATORY_CARE_PROVIDER_SITE_OTHER): Payer: BC Managed Care – PPO | Admitting: Internal Medicine

## 2016-01-12 ENCOUNTER — Other Ambulatory Visit (INDEPENDENT_AMBULATORY_CARE_PROVIDER_SITE_OTHER): Payer: BC Managed Care – PPO

## 2016-01-12 VITALS — BP 132/80 | HR 72 | Ht 62.0 in | Wt 185.0 lb

## 2016-01-12 DIAGNOSIS — K582 Mixed irritable bowel syndrome: Secondary | ICD-10-CM | POA: Diagnosis not present

## 2016-01-12 DIAGNOSIS — R1013 Epigastric pain: Secondary | ICD-10-CM | POA: Diagnosis not present

## 2016-01-12 DIAGNOSIS — R140 Abdominal distension (gaseous): Secondary | ICD-10-CM | POA: Diagnosis not present

## 2016-01-12 LAB — COMPREHENSIVE METABOLIC PANEL
ALK PHOS: 64 U/L (ref 39–117)
ALT: 13 U/L (ref 0–35)
AST: 13 U/L (ref 0–37)
Albumin: 4.2 g/dL (ref 3.5–5.2)
BILIRUBIN TOTAL: 0.9 mg/dL (ref 0.2–1.2)
BUN: 10 mg/dL (ref 6–23)
CALCIUM: 9.3 mg/dL (ref 8.4–10.5)
CO2: 23 meq/L (ref 19–32)
CREATININE: 0.7 mg/dL (ref 0.40–1.20)
Chloride: 105 mEq/L (ref 96–112)
GFR: 104.44 mL/min (ref >60.00)
GLUCOSE: 92 mg/dL (ref 70–99)
Potassium: 3.9 mEq/L (ref 3.5–5.1)
Sodium: 138 mEq/L (ref 135–145)
TOTAL PROTEIN: 7.1 g/dL (ref 6.0–8.3)

## 2016-01-12 LAB — IGA: IgA: 275 mg/dL (ref 68–378)

## 2016-01-12 MED ORDER — RANITIDINE HCL 150 MG PO TABS: 150.0000 mg | ORAL_TABLET | Freq: Two times a day (BID) | ORAL | 2 refills | Status: DC | PRN

## 2016-01-12 NOTE — Patient Instructions (Signed)
Your physician has requested that you go to the basement for the following lab work before leaving today: CBC, CMP, Celiac panel  Please purchase the following medications over the counter and take as directed: Benefiber 1 tablespoon daily  Continue Activa.  We have sent the following medications to your pharmacy for you to pick up at your convenience: Zantac 150 mg twice daily as needed (Please make sure that you get an okay from your OB prior to taking this medication)  You have [facility]1st [facility] of hospital) on 01/21/16 at 11:30 am. Please arrive 15 minutes prior to your appointment for registration. Make certain not to have anything to eat or drink 6 hours prior to your appointment. Should you need to reschedule your appointment, please contact radiology at [phone]. This test typically takes about 30 minutes to perform.  If you are age 30 or older, your body mass index should be between 23-30. Your Body mass index is 33.84 kg/m. If this is out of the aforementioned range listed, please consider follow up with your Primary Care Provider.  If you are age 864 or younger, your body mass index should be between 19-25. Your Body mass index is 33.84 kg/m. If this is out of the aformentioned range listed, please consider follow up with your Primary Care Provider.

## 2016-01-12 NOTE — Progress Notes (Signed)
Patient ID: Leslie Beck Keirsey, female   DOB: 09/20/1985, 30 y.o.   MRN: [id number] HPI: Leslie Beck Adamek is a 30 year old female with a past medical history of atopic dermatitis, GERD who is seen in consultation at the request of Dr. Waynard ReedsKendra Ross to evaluate alternating diarrhea constipation and abdominal pain. She is here today with her husband.  She states that beginning around January or February 2017 she developed "upset stomach" along with loose stools after eating. This started after a viral illness which she thought was probably "the flu". Initially was primarily loose stools and diarrhea which were urgent after meals. This lasted for several weeks to a month and then she had alternating diarrhea and constipation. It has continued to involve and wax and wane. She developed burning in her throat and atypical chest pain. She took over-the-counter Prilosec 20 mg daily for 14 days and the symptoms improved tremendously. It also seemed to help her bowel movements, particularly loose stools and also gas and bloating. She has continued to have intermittent issues with abdominal gas and bloating. This gets better when she is able to have regular bowel movements. She has had some left-sided primarily left upper abdominal discomfort which it occurs sporadically. Primary care put her on twice daily omeprazole for a month back in the summer of 2017, symptoms got better and she eventually weaned back off of Prilosec.  Most recently she has become pregnant, now approximately 8 weeks. She's noticed issues with abdominal gas and bloating and intermittent left-sided discomfort. However for the last week her bowel movements at been completely regular in her gas and bloating convinced tremendously better. She started [place] yogurt 3 or 4 days ago.  She has not had much heartburn but occasionally feels "gurgling" in her throat.  No real nausea or vomiting. No dysphagia or odynophagia. No blood in her stool or melena. No  weight loss.  Family history notable for paternal grandmother with colorectal cancer in her 2440s, and maternal grandfather with colon cancer in his 5660s. Denies family history of IBD or celiac disease. She has 30 year old daughter. She works as a Runner, broadcasting/film/videoteacher. Never smoker. No illicit drugs. No alcohol use.  Past Medical History:  Diagnosis Date  . ALLERGIC RHINITIS   . Asthma   . GERD (gastroesophageal reflux disease)    not on daily PPI anymore  . Leg pain    Bilat.  No DVT  . Obesity, Class II, BMI 35-39.9   . Psoriasis    vs dyshidrotic dermatitis per latest eval by Dr. Terri PiedraLupton 08/15/13  . Recurrent UTI 2012/13    Past Surgical History:  Procedure Laterality Date  . NO PAST SURGERIES      Outpatient Medications Prior to Visit  Medication Sig Dispense Refill  . clobetasol ointment (TEMOVATE) 0.05 % Apply 1 application topically at bedtime as needed (psoriasis). 30 g 1  . omeprazole (PRILOSEC) 20 MG capsule Take 1 capsule (20 mg total) by mouth 2 (two) times daily before a meal. (Patient not taking: Reported on 01/12/2016) 60 capsule 1   No facility-administered medications prior to visit.     Allergies  Allergen Reactions  . Zithromax [Azithromycin] Rash    Family History  Problem Relation Age of Onset  . Hypertension Mother   . Hypothyroidism Mother   . Other Mother     varicose veins  . Hypertension Father   . Colon cancer Maternal Grandfather   . Colon cancer Paternal Grandmother     Social History  Substance Use Topics  .  Smoking status: Never Smoker  . Smokeless tobacco: Never Used  . Alcohol use No    ROS: As per history of present illness, otherwise negative  BP 132/80   Pulse 72   Ht 5\' 2"  (1.575 m)   Wt 185 lb (83.9 kg)   LMP 06/05/2015   BMI 33.84 kg/m  Constitutional: Well-developed and well-nourished. No distress. HEENT: Normocephalic and atraumatic.  Conjunctivae are normal.  No scleral icterus. Neck: Neck supple. Trachea  midline. Cardiovascular: Normal rate, regular rhythm and intact distal pulses. No M/R/G Pulmonary/chest: Effort normal and breath sounds normal. No wheezing, rales or rhonchi. Abdominal: Soft, obese,  nontender, nondistended. Bowel sounds active throughout. There are no masses palpable. No hepatosplenomegaly. Extremities: no clubbing, cyanosis, or edema Neurological: Alert and oriented to person place and time. Skin: Skin is warm and dry. No rashes noted. Psychiatric: Normal mood and affect. Behavior is normal.  RELEVANT LABS AND IMAGING: CBC    Component Value Date/Time   WBC 10.3 07/03/2014 1321   RBC 4.74 07/03/2014 1321   HGB 14.4 07/03/2014 1321   HCT 41.5 07/03/2014 1321   PLT 275.0 07/03/2014 1321   MCV 87.4 07/03/2014 1321   MCH 31.0 07/20/2012 0607   MCHC 34.7 07/03/2014 1321   RDW 12.9 07/03/2014 1321   LYMPHSABS 3.2 07/03/2014 1321   MONOABS 0.5 07/03/2014 1321   EOSABS 0.2 07/03/2014 1321   BASOSABS 0.0 07/03/2014 1321    CMP     Component Value Date/Time   NA 137 03/27/2015 2040   K 3.6 03/27/2015 2040   CL 105 03/27/2015 2040   CO2 20 (L) 03/27/2015 2040   GLUCOSE 138 (H) 03/27/2015 2040   BUN 9 03/27/2015 2040   CREATININE 0.65 03/27/2015 2040   CALCIUM 8.8 (L) 03/27/2015 2040   PROT 7.3 07/03/2014 1321   ALBUMIN 4.3 07/03/2014 1321   AST 15 07/03/2014 1321   ALT 13 07/03/2014 1321   ALKPHOS 100 07/03/2014 1321   BILITOT 0.7 07/03/2014 1321   GFRNONAA >60 03/27/2015 2040   GFRAA >60 03/27/2015 2040    ASSESSMENT/PLAN: 30 year old female with a past medical history of atopic dermatitis, GERD who is seen in consultation at the request of Dr. Waynard ReedsKendra Ross to evaluate alternating diarrhea constipation and abdominal pain.   1. Alternating diarrhea and constipation/abdominal bloating and gas/left upper and middle abdominal discomfort -- symptoms are most consistent with irritable bowel. There are no alarm symptoms. In the setting of her pregnancy I have  recommended we avoid PPI possible though she did have a favorable response to PPI each time she has used it. This is likely helping a component of reflux and dyspepsia. I recommended ranitidine 150 mg twice a day as needed. She seems to be benefiting from the addition of probiotic yogurt which she plans to continue. I've also recommended Benefiber 1 tablespoon daily to try to help regulate bowel movements and prevent diarrhea and constipation. Her abdominal bloating and gas improved dramatically after regular bowel movements. Check celiac panel, CBC and CMP today. Ultrasound abdomen. No overt alarm symptoms to warrant endoscopy or colonoscopy at present. We will follow this going forward. Reassurance provided. Follow-up in 3 months, sooner if necessary  2. Family history of colon cancer -- 2 second-degree relatives, screening colonoscopy recommended at age 78     ZO:XWRUEACc:Kendra Ross, Md [address] [place], [place] [id number]

## 2016-01-13 ENCOUNTER — Telehealth: Payer: Self-pay | Admitting: Internal Medicine

## 2016-01-13 LAB — CBC WITH DIFFERENTIAL/PLATELET
BASOS ABS: 0.1 10*3/uL (ref 0.0–0.1)
Basophils Relative: 0.6 % (ref 0.0–3.0)
Eosinophils Absolute: 0.1 10*3/uL (ref 0.0–0.7)
Eosinophils Relative: 0.9 % (ref 0.0–5.0)
HEMATOCRIT: 40.1 % (ref 36.0–46.0)
HEMOGLOBIN: 13.9 g/dL (ref 12.0–15.0)
LYMPHS PCT: 23.9 % (ref 12.0–46.0)
Lymphs Abs: 2.9 10*3/uL (ref 0.7–4.0)
MCHC: 34.6 g/dL (ref 30.0–36.0)
MCV: 89.7 fl (ref 78.0–100.0)
MONOS PCT: 5.7 % (ref 3.0–12.0)
Monocytes Absolute: 0.7 10*3/uL (ref 0.1–1.0)
NEUTROS ABS: 8.3 10*3/uL — AB (ref 1.4–7.7)
Neutrophils Relative %: 68.9 % (ref 43.0–77.0)
Platelets: 261 10*3/uL (ref 150.0–400.0)
RBC: 4.47 Mil/uL (ref 3.87–5.11)
RDW: 12.7 % (ref 11.5–15.5)
WBC: 12.1 10*3/uL — AB (ref 4.0–10.5)

## 2016-01-13 LAB — TISSUE TRANSGLUTAMINASE, IGA: Tissue Transglutaminase Ab, IgA: 1 U/mL (ref <4)

## 2016-01-13 NOTE — Telephone Encounter (Signed)
See results note from today 

## 2016-01-14 ENCOUNTER — Other Ambulatory Visit: Payer: Self-pay | Admitting: Obstetrics and Gynecology

## 2016-01-21 ENCOUNTER — Ambulatory Visit (HOSPITAL_COMMUNITY)
Admission: RE | Admit: 2016-01-21 | Discharge: 2016-01-21 | Disposition: A | Discharge status: Home / Self Care | Payer: BC Managed Care – PPO | Source: Ambulatory Visit | Attending: Internal Medicine | Admitting: Internal Medicine

## 2016-01-21 DIAGNOSIS — R1310 Dysphagia, unspecified: Secondary | ICD-10-CM | POA: Diagnosis not present

## 2016-01-21 DIAGNOSIS — R1013 Epigastric pain: Secondary | ICD-10-CM

## 2016-02-15 NOTE — L&D Delivery Note (Signed)
I was called for standby delivery for Dr. Tenny Crawoss, pt was involuntarily pushing. Delivery Note At 7:23 AM a viable female was delivered w/o difficulty via Vaginal, Spontaneous Delivery (Presentation: OA). Baby passed to maternal abdomen. Dr. Tenny Crawoss entered room right at delivery. See additional delivery note for further details. Donette LarryMelanie Carina Chaplin, CNM 08/17/2016, 9:01 AM

## 2016-02-15 NOTE — L&D Delivery Note (Signed)
Delivery Note At 7:23 AM a viable and healthy female was delivered via Vaginal, Spontaneous Delivery (Presentation: Left Occiput; Anterior ).  APGAR: 9, 9; weight 6 lb 14.8 oz (3140 g).   Placenta status: Spontaneous, intact.  Cord:  3V  Pt quickly progressed in labor. Pt received epidural but precipitously delivered within 10 minutes of administration. CNM was standing bed and delivered the baby. I arrived as the baby was delivering. The CNM passed the baby to the maternal abdomen. After 1 minute the cord was clamped and cut. Pitocin infusion was initiated and the placenta delivered spontaneosly, intact with 3V cord. Within 1-2 minutes of delivery od the placenta the patient began complaining of acute onset of left sided chest pain. O2 sat was 100% but O2 was applied. A 12 lead EKG was performed and NSR. Morphine 10mg  was administered. Pitocin infusion was stopped. Anesthesia was called and came to the bedside. Chest pain improved. Pitocin infusion was restarted and patient had resumption of chest pain. Infusion was again stopped. Oxygen saturation remained 100% and the patient's blood pressures were normotensive. The patient's pain improved and the 2nd degree repair was completed. The uterus was firm. Mother and baby are both doing well. Toradol 60mg  was administered   Anesthesia:  Epidural Episiotomy: None Lacerations:  2nd degree Suture Repair: 3.0 vicryl Est. Blood Loss (mL):  350 cc  Mom to postpartum.  Baby to Couplet care / Skin to Skin.  Evolette Pendell H. 08/17/2016, 8:23 AM

## 2016-04-01 ENCOUNTER — Other Ambulatory Visit (HOSPITAL_COMMUNITY): Payer: Self-pay | Admitting: Obstetrics and Gynecology

## 2016-04-01 DIAGNOSIS — IMO0002 Reserved for concepts with insufficient information to code with codable children: Secondary | ICD-10-CM

## 2016-04-01 DIAGNOSIS — Z3689 Encounter for other specified antenatal screening: Secondary | ICD-10-CM

## 2016-04-01 DIAGNOSIS — O358XX0 Maternal care for other (suspected) fetal abnormality and damage, not applicable or unspecified: Secondary | ICD-10-CM

## 2016-04-04 ENCOUNTER — Encounter (HOSPITAL_COMMUNITY): Payer: Self-pay | Admitting: Obstetrics and Gynecology

## 2016-04-10 ENCOUNTER — Other Ambulatory Visit: Payer: Self-pay

## 2016-04-11 ENCOUNTER — Encounter (HOSPITAL_COMMUNITY): Payer: Self-pay | Admitting: *Deleted

## 2016-04-12 ENCOUNTER — Other Ambulatory Visit (HOSPITAL_COMMUNITY): Payer: Self-pay | Admitting: Obstetrics and Gynecology

## 2016-04-12 ENCOUNTER — Encounter (HOSPITAL_COMMUNITY): Payer: Self-pay

## 2016-04-12 ENCOUNTER — Ambulatory Visit (HOSPITAL_COMMUNITY): Payer: BC Managed Care – PPO

## 2016-04-12 ENCOUNTER — Ambulatory Visit (HOSPITAL_COMMUNITY)
Admission: RE | Admit: 2016-04-12 | Discharge: 2016-04-12 | Disposition: A | Discharge status: Home / Self Care | Payer: BC Managed Care – PPO | Source: Ambulatory Visit | Attending: Obstetrics and Gynecology | Admitting: Obstetrics and Gynecology

## 2016-04-12 ENCOUNTER — Encounter (HOSPITAL_COMMUNITY): Payer: BC Managed Care – PPO

## 2016-04-12 DIAGNOSIS — O358XX0 Maternal care for other (suspected) fetal abnormality and damage, not applicable or unspecified: Secondary | ICD-10-CM

## 2016-04-12 DIAGNOSIS — Z3689 Encounter for other specified antenatal screening: Secondary | ICD-10-CM | POA: Diagnosis not present

## 2016-04-12 DIAGNOSIS — O99212 Obesity complicating pregnancy, second trimester: Secondary | ICD-10-CM | POA: Diagnosis not present

## 2016-04-12 DIAGNOSIS — O283 Abnormal ultrasonic finding on antenatal screening of mother: Secondary | ICD-10-CM | POA: Diagnosis not present

## 2016-04-12 DIAGNOSIS — IMO0002 Reserved for concepts with insufficient information to code with codable children: Secondary | ICD-10-CM

## 2016-04-12 DIAGNOSIS — Z3A20 20 weeks gestation of pregnancy: Secondary | ICD-10-CM | POA: Diagnosis not present

## 2016-06-24 ENCOUNTER — Encounter: Payer: Self-pay | Admitting: *Deleted

## 2016-07-19 ENCOUNTER — Other Ambulatory Visit (HOSPITAL_COMMUNITY): Payer: Self-pay | Admitting: Obstetrics and Gynecology

## 2016-07-19 DIAGNOSIS — M7989 Other specified soft tissue disorders: Principal | ICD-10-CM

## 2016-07-19 DIAGNOSIS — M79661 Pain in right lower leg: Secondary | ICD-10-CM

## 2016-07-20 ENCOUNTER — Ambulatory Visit (HOSPITAL_COMMUNITY)
Admission: RE | Admit: 2016-07-20 | Discharge: 2016-07-20 | Disposition: A | Discharge status: Home / Self Care | Payer: BC Managed Care – PPO | Source: Ambulatory Visit | Attending: Obstetrics and Gynecology | Admitting: Obstetrics and Gynecology

## 2016-07-20 ENCOUNTER — Encounter (HOSPITAL_COMMUNITY): Payer: BC Managed Care – PPO

## 2016-07-20 DIAGNOSIS — M7989 Other specified soft tissue disorders: Secondary | ICD-10-CM | POA: Diagnosis not present

## 2016-07-20 DIAGNOSIS — M79661 Pain in right lower leg: Secondary | ICD-10-CM | POA: Insufficient documentation

## 2016-07-20 NOTE — Progress Notes (Signed)
**  Preliminary report by tech**  Right lower extremity venous duplex complete. There is no evidence of deep or superficial vein thrombosis involving the right lower extremity. All visualized vessels appear patent and compressible. There is no evidence of a Baker's cyst on the right. Results were given to Meagan at Dr. Tenny Crawoss' office.  07/20/16 2:21 PM Olen CordialGreg Winnifred Dufford RVT

## 2016-08-17 ENCOUNTER — Inpatient Hospital Stay (HOSPITAL_COMMUNITY)
Admission: AD | Admit: 2016-08-17 | Discharge: 2016-08-19 | DRG: 775 | Disposition: A | Discharge status: Home / Self Care | Payer: BC Managed Care – PPO | Source: Ambulatory Visit | Attending: Obstetrics and Gynecology | Admitting: Obstetrics and Gynecology

## 2016-08-17 ENCOUNTER — Inpatient Hospital Stay (HOSPITAL_COMMUNITY): Payer: BC Managed Care – PPO | Admitting: Anesthesiology

## 2016-08-17 ENCOUNTER — Encounter (HOSPITAL_COMMUNITY): Payer: Self-pay | Admitting: *Deleted

## 2016-08-17 DIAGNOSIS — O99214 Obesity complicating childbirth: Secondary | ICD-10-CM | POA: Diagnosis present

## 2016-08-17 DIAGNOSIS — Z3493 Encounter for supervision of normal pregnancy, unspecified, third trimester: Secondary | ICD-10-CM | POA: Diagnosis present

## 2016-08-17 DIAGNOSIS — Z6839 Body mass index (BMI) 39.0-39.9, adult: Secondary | ICD-10-CM | POA: Diagnosis not present

## 2016-08-17 DIAGNOSIS — Z3A38 38 weeks gestation of pregnancy: Secondary | ICD-10-CM

## 2016-08-17 LAB — RPR: RPR: NONREACTIVE

## 2016-08-17 LAB — CBC
HCT: 40.5 % (ref 36.0–46.0)
HEMOGLOBIN: 14 g/dL (ref 12.0–15.0)
MCH: 31.3 pg (ref 26.0–34.0)
MCHC: 34.6 g/dL (ref 30.0–36.0)
MCV: 90.6 fL (ref 78.0–100.0)
PLATELETS: 180 10*3/uL (ref 150–400)
RBC: 4.47 MIL/uL (ref 3.87–5.11)
RDW: 13.3 % (ref 11.5–15.5)
WBC: 13.2 10*3/uL — ABNORMAL HIGH (ref 4.0–10.5)

## 2016-08-17 LAB — TYPE AND SCREEN
ABO/RH(D): O POS
Antibody Screen: NEGATIVE

## 2016-08-17 LAB — COMPREHENSIVE METABOLIC PANEL
ALT: 14 U/L (ref 14–54)
AST: 18 U/L (ref 15–41)
Albumin: 3 g/dL — ABNORMAL LOW (ref 3.5–5.0)
Alkaline Phosphatase: 211 U/L — ABNORMAL HIGH (ref 38–126)
Anion gap: 9 (ref 5–15)
BUN: 10 mg/dL (ref 6–20)
CHLORIDE: 107 mmol/L (ref 101–111)
CO2: 22 mmol/L (ref 22–32)
CREATININE: 0.64 mg/dL (ref 0.44–1.00)
Calcium: 9.2 mg/dL (ref 8.9–10.3)
GFR calc Af Amer: >60 mL/min (ref >60)
GLUCOSE: 101 mg/dL — AB (ref 65–99)
Potassium: 4.4 mmol/L (ref 3.5–5.1)
Sodium: 138 mmol/L (ref 135–145)
Total Bilirubin: 0.7 mg/dL (ref 0.3–1.2)
Total Protein: 6.7 g/dL (ref 6.5–8.1)

## 2016-08-17 LAB — URIC ACID: Uric Acid, Serum: 5 mg/dL (ref 2.3–6.6)

## 2016-08-17 LAB — LACTATE DEHYDROGENASE: LDH: 164 U/L (ref 98–192)

## 2016-08-17 MED ORDER — SOD CITRATE-CITRIC ACID 500-334 MG/5ML PO SOLN: 30.0000 mL | ORAL | Status: DC | PRN

## 2016-08-17 MED ORDER — IBUPROFEN 600 MG PO TABS
600.0000 mg | ORAL_TABLET | Freq: Four times a day (QID) | ORAL | Status: DC
  Administered 2016-08-17 – 2016-08-19 (×8): 600 mg via ORAL
  Filled 2016-08-17 (×8): qty 1

## 2016-08-17 MED ORDER — BUTORPHANOL TARTRATE 1 MG/ML IJ SOLN
1.0000 mg | INTRAMUSCULAR | Status: DC | PRN
  Administered 2016-08-17: 1 mg via INTRAVENOUS
  Filled 2016-08-17: qty 1

## 2016-08-17 MED ORDER — ONDANSETRON HCL 4 MG/2ML IJ SOLN: 4.0000 mg | Freq: Four times a day (QID) | INTRAMUSCULAR | Status: DC | PRN

## 2016-08-17 MED ORDER — KETOROLAC TROMETHAMINE 60 MG/2ML IM SOLN
60.0000 mg | Freq: Once | INTRAMUSCULAR | Status: AC
  Administered 2016-08-17: 60 mg via INTRAMUSCULAR
  Filled 2016-08-17: qty 2

## 2016-08-17 MED ORDER — OXYTOCIN 40 UNITS IN LACTATED RINGERS INFUSION - SIMPLE MED
2.5000 [IU]/h | INTRAVENOUS | Status: DC
  Filled 2016-08-17: qty 1000

## 2016-08-17 MED ORDER — OXYTOCIN 40 UNITS IN LACTATED RINGERS INFUSION - SIMPLE MED: 2.5000 [IU]/h | INTRAVENOUS | Status: DC

## 2016-08-17 MED ORDER — LACTATED RINGERS IV SOLN: 500.0000 mL | INTRAVENOUS | Status: DC | PRN

## 2016-08-17 MED ORDER — OXYTOCIN BOLUS FROM INFUSION
500.0000 mL | Freq: Once | INTRAVENOUS | Status: AC
  Administered 2016-08-17: 500 mL via INTRAVENOUS

## 2016-08-17 MED ORDER — LACTATED RINGERS IV SOLN: 500.0000 mL | Freq: Once | INTRAVENOUS | Status: DC

## 2016-08-17 MED ORDER — PHENYLEPHRINE 40 MCG/ML (10ML) SYRINGE FOR IV PUSH (FOR BLOOD PRESSURE SUPPORT)
80.0000 ug | PREFILLED_SYRINGE | INTRAVENOUS | Status: DC | PRN
  Filled 2016-08-17: qty 5

## 2016-08-17 MED ORDER — ACETAMINOPHEN 325 MG PO TABS: 650.0000 mg | ORAL_TABLET | ORAL | Status: DC | PRN

## 2016-08-17 MED ORDER — OXYCODONE-ACETAMINOPHEN 5-325 MG PO TABS: 1.0000 | ORAL_TABLET | ORAL | Status: DC | PRN

## 2016-08-17 MED ORDER — PHENYLEPHRINE 40 MCG/ML (10ML) SYRINGE FOR IV PUSH (FOR BLOOD PRESSURE SUPPORT)
80.0000 ug | PREFILLED_SYRINGE | INTRAVENOUS | Status: DC | PRN
  Filled 2016-08-17: qty 10
  Filled 2016-08-17: qty 5

## 2016-08-17 MED ORDER — ONDANSETRON HCL 4 MG PO TABS: 4.0000 mg | ORAL_TABLET | ORAL | Status: DC | PRN

## 2016-08-17 MED ORDER — ONDANSETRON HCL 4 MG/2ML IJ SOLN: 4.0000 mg | INTRAMUSCULAR | Status: DC | PRN

## 2016-08-17 MED ORDER — FENTANYL 2.5 MCG/ML BUPIVACAINE 1/10 % EPIDURAL INFUSION (WH - ANES)
14.0000 mL/h | INTRAMUSCULAR | Status: DC | PRN
  Administered 2016-08-17: 14 mL/h via EPIDURAL
  Filled 2016-08-17: qty 100

## 2016-08-17 MED ORDER — EPHEDRINE 5 MG/ML INJ
10.0000 mg | INTRAVENOUS | Status: DC | PRN
  Filled 2016-08-17: qty 2

## 2016-08-17 MED ORDER — MORPHINE SULFATE (PF) 10 MG/ML IV SOLN
10.0000 mg | Freq: Once | INTRAVENOUS | Status: AC
  Administered 2016-08-17: 10 mg via INTRAVENOUS
  Filled 2016-08-17: qty 1

## 2016-08-17 MED ORDER — WITCH HAZEL-GLYCERIN EX PADS: 1.0000 "application " | MEDICATED_PAD | CUTANEOUS | Status: DC | PRN

## 2016-08-17 MED ORDER — DIPHENHYDRAMINE HCL 50 MG/ML IJ SOLN: 12.5000 mg | INTRAMUSCULAR | Status: DC | PRN

## 2016-08-17 MED ORDER — TETANUS-DIPHTH-ACELL PERTUSSIS 5-2.5-18.5 LF-MCG/0.5 IM SUSP: 0.5000 mL | Freq: Once | INTRAMUSCULAR | Status: DC

## 2016-08-17 MED ORDER — MISOPROSTOL 200 MCG PO TABS
ORAL_TABLET | ORAL | Status: AC
  Filled 2016-08-17: qty 4

## 2016-08-17 MED ORDER — DIPHENHYDRAMINE HCL 25 MG PO CAPS: 25.0000 mg | ORAL_CAPSULE | Freq: Four times a day (QID) | ORAL | Status: DC | PRN

## 2016-08-17 MED ORDER — FLEET ENEMA 7-19 GM/118ML RE ENEM: 1.0000 | ENEMA | RECTAL | Status: DC | PRN

## 2016-08-17 MED ORDER — LIDOCAINE HCL (PF) 1 % IJ SOLN: 30.0000 mL | INTRAMUSCULAR | Status: DC | PRN

## 2016-08-17 MED ORDER — ZOLPIDEM TARTRATE 5 MG PO TABS: 5.0000 mg | ORAL_TABLET | Freq: Every evening | ORAL | Status: DC | PRN

## 2016-08-17 MED ORDER — LIDOCAINE HCL (PF) 1 % IJ SOLN
30.0000 mL | INTRAMUSCULAR | Status: AC | PRN
  Administered 2016-08-17: 30 mL via SUBCUTANEOUS
  Filled 2016-08-17: qty 30

## 2016-08-17 MED ORDER — OXYCODONE-ACETAMINOPHEN 5-325 MG PO TABS: 2.0000 | ORAL_TABLET | ORAL | Status: DC | PRN

## 2016-08-17 MED ORDER — COCONUT OIL OIL: 1.0000 "application " | TOPICAL_OIL | Status: DC | PRN

## 2016-08-17 MED ORDER — LACTATED RINGERS IV SOLN: INTRAVENOUS | Status: DC

## 2016-08-17 MED ORDER — OXYTOCIN BOLUS FROM INFUSION: 500.0000 mL | Freq: Once | INTRAVENOUS | Status: DC

## 2016-08-17 MED ORDER — SENNOSIDES-DOCUSATE SODIUM 8.6-50 MG PO TABS
2.0000 | ORAL_TABLET | ORAL | Status: DC
  Administered 2016-08-18 (×2): 2 via ORAL
  Filled 2016-08-17 (×2): qty 2

## 2016-08-17 MED ORDER — SIMETHICONE 80 MG PO CHEW: 80.0000 mg | CHEWABLE_TABLET | ORAL | Status: DC | PRN

## 2016-08-17 MED ORDER — PRENATAL MULTIVITAMIN CH
1.0000 | ORAL_TABLET | Freq: Every day | ORAL | Status: DC
  Administered 2016-08-17 – 2016-08-19 (×3): 1 via ORAL
  Filled 2016-08-17 (×3): qty 1

## 2016-08-17 MED ORDER — DIBUCAINE 1 % RE OINT: 1.0000 "application " | TOPICAL_OINTMENT | RECTAL | Status: DC | PRN

## 2016-08-17 MED ORDER — LIDOCAINE HCL (PF) 1 % IJ SOLN
INTRAMUSCULAR | Status: DC | PRN
  Administered 2016-08-17: 13 mL via EPIDURAL

## 2016-08-17 MED ORDER — BENZOCAINE-MENTHOL 20-0.5 % EX AERO
1.0000 "application " | INHALATION_SPRAY | CUTANEOUS | Status: DC | PRN
  Administered 2016-08-17: 1 via TOPICAL
  Filled 2016-08-17: qty 56

## 2016-08-17 MED ORDER — NITROGLYCERIN 0.4 MG SL SUBL
0.4000 mg | SUBLINGUAL_TABLET | SUBLINGUAL | Status: DC | PRN
  Filled 2016-08-17: qty 25

## 2016-08-17 MED ORDER — LACTATED RINGERS IV SOLN
INTRAVENOUS | Status: DC
  Administered 2016-08-17: 06:00:00 via INTRAVENOUS

## 2016-08-17 NOTE — Anesthesia Postprocedure Evaluation (Signed)
Anesthesia Post Note  Patient: Leslie Beck  Procedure(s) Performed: * No procedures listed *     Patient location during evaluation: Mother Baby Anesthesia Type: Epidural Level of consciousness: awake, awake and alert, oriented and patient cooperative Pain management: pain level controlled Vital Signs Assessment: post-procedure vital signs reviewed and stable Respiratory status: spontaneous breathing, nonlabored ventilation and respiratory function stable Cardiovascular status: stable Postop Assessment: no headache, no backache, patient able to bend at knees and no signs of nausea or vomiting Anesthetic complications: no    Last Vitals:  Vitals:   08/17/16 0957 08/17/16 1100  BP: 100/65 110/64  Pulse: 86 75  Resp: 18 17  Temp: 37 C 36.7 C    Last Pain:  Vitals:   08/17/16 1100  TempSrc: Oral  PainSc: 0-No pain   Pain Goal:                 Lorraine Terriquez L

## 2016-08-17 NOTE — MAU Note (Signed)
Pt reports uc's since 0430.

## 2016-08-17 NOTE — H&P (Signed)
Leslie ConroyBrandy Beck is a 31 y.o. female presenting for painful contractions  31 yo G2P1001 @ 38+4 presents for painful contractions and was confirmed to be in labor. Her pregnancy was complicated by echogenic bowel noted on her anatomy US. Follow-up evaluation with Maternal Fetal Medicine did not re-demonstrate echogenic bowel. Work-up was negative OB History    Gravida Para Term Preterm AB Living   2 1 1     1    SAB TAB Ectopic Multiple Live Births           1     Past Medical History:  Diagnosis Date  . ALLERGIC RHINITIS   . Asthma   . Blood type O+ 01/17/2012   Done by OB/GYN  . Family history of colon cancer    2 second degree relatives: needs to start screening colonoscopies at age 31 per GI.  Marland Kitchen. GERD (gastroesophageal reflux disease)    not on daily PPI anymore  . IBS (irritable bowel syndrome) 2017  . Leg pain    Bilat.  No DVT  . Obesity, Class II, BMI 35-39.9   . Psoriasis    vs dyshidrotic dermatitis per latest eval by Dr. Terri PiedraLupton 08/15/13  . Recurrent UTI 2012/13   Past Surgical History:  Procedure Laterality Date  . NO PAST SURGERIES     Family History: family history includes Colon cancer in her maternal grandfather and paternal grandmother; Hypertension in her father and mother; Hypothyroidism in her mother; Other in her mother. Social History:  reports that she has never smoked. She has never used smokeless tobacco. She reports that she does not drink alcohol or use drugs.     Maternal Diabetes: No Genetic Screening: Normal Maternal Ultrasounds/Referrals: Abnormal:  Findings:   Echogenic bowel Fetal Ultrasounds or other Referrals:  Referred to Materal Fetal Medicine  Maternal Substance Abuse:  No Significant Maternal Medications:  None Significant Maternal Lab Results:  None Other Comments:  None  ROS History Dilation: 4 Effacement (%): 100 Station: -1 Exam by:: B Mosca Blood pressure (!) 146/79, pulse 95, last menstrual period 11/21/2015. Exam Physical  Exam  Prenatal labs: ABO, Rh:  O + Antibody:  Neg Rubella:  Imm RPR:   NR HBsAg:   Neg HIV:   NR GBS:   Neg  Assessment/Plan: 1) Admit 2) Epidural on request 3) Fetal well being reassuring   Leslie Beck H. 08/17/2016, 5:55 AM

## 2016-08-17 NOTE — Anesthesia Preprocedure Evaluation (Signed)
Anesthesia Evaluation  Patient identified by MRN, date of birth, ID band Patient awake    Reviewed: Allergy & Precautions, H&P , Patient's Chart, lab work & pertinent test results  Airway Mallampati: III  TM Distance: >3 FB Neck ROM: full    Dental no notable dental hx. (+) Teeth Intact   Pulmonary asthma ,    Pulmonary exam normal breath sounds clear to auscultation       Cardiovascular negative cardio ROS   Rhythm:regular Rate:Normal     Neuro/Psych negative neurological ROS  negative psych ROS   GI/Hepatic Neg liver ROS, GERD  Medicated and Controlled,  Endo/Other  Morbid obesity  Renal/GU negative Renal ROS   Recurrent UTI    Musculoskeletal Psoriasis   Abdominal Normal abdominal exam  (+)   Peds  Hematology negative hematology ROS (+)   Anesthesia Other Findings   Reproductive/Obstetrics (+) Pregnancy                             Anesthesia Physical  Anesthesia Plan  ASA: II  Anesthesia Plan: Epidural   Post-op Pain Management:    Induction:   PONV Risk Score and Plan:   Airway Management Planned:   Additional Equipment:   Intra-op Plan:   Post-operative Plan:   Informed Consent: I have reviewed the patients History and Physical, chart, labs and discussed the procedure including the risks, benefits and alternatives for the proposed anesthesia with the patient or authorized representative who has indicated his/her understanding and acceptance.     Plan Discussed with: Anesthesiologist  Anesthesia Plan Comments:         Anesthesia Quick Evaluation

## 2016-08-17 NOTE — Anesthesia Procedure Notes (Signed)
Epidural Patient location during procedure: OB Start time: 08/17/2016 6:56 AM End time: 08/17/2016 7:11 AM  Staffing Anesthesiologist: Anitra LauthMILLER, Zyann Mabry RAY Performed: anesthesiologist   Preanesthetic Checklist Completed: patient identified, site marked, surgical consent, pre-op evaluation, timeout performed, IV checked, risks and benefits discussed and monitors and equipment checked  Epidural Patient position: sitting Prep: DuraPrep Patient monitoring: heart rate, cardiac monitor, continuous pulse ox and blood pressure Approach: midline Location: L2-L3 Injection technique: LOR saline  Needle:  Needle type: Tuohy  Needle gauge: 17 G Needle length: 9 cm Needle insertion depth: 7 cm Catheter type: closed end flexible Catheter size: 20 Guage Catheter at skin depth: 10 cm Test dose: negative  Assessment Events: blood not aspirated, injection not painful, no injection resistance, negative IV test and no paresthesia  Additional Notes Reason for block:procedure for pain

## 2016-08-17 NOTE — Progress Notes (Signed)
Baby delivered at 0723 and pt began complaining of sudden onset chest pain at 0727.  Pulse ox applied and pt 02 was 100%.  BP slightly elevated. Pt reports that left sided chest tightening is worsening.  o2 applied at 10l/min, rapid response called as well as anesthesia.  Stat ekg performed and is normal sinus.  Pitocin bolus stopped, iv morphine given.  When pitocin bolus was restarted, pt complains again of chest pain so discontinued for good. Pt reports that discomfort is easing once this is stopped. 02 removed and infant given back to mom.

## 2016-08-18 LAB — CBC
HCT: 34.6 % — ABNORMAL LOW (ref 36.0–46.0)
Hemoglobin: 11.7 g/dL — ABNORMAL LOW (ref 12.0–15.0)
MCH: 31.3 pg (ref 26.0–34.0)
MCHC: 33.8 g/dL (ref 30.0–36.0)
MCV: 92.5 fL (ref 78.0–100.0)
PLATELETS: 151 10*3/uL (ref 150–400)
RBC: 3.74 MIL/uL — AB (ref 3.87–5.11)
RDW: 13.8 % (ref 11.5–15.5)
WBC: 12.4 10*3/uL — ABNORMAL HIGH (ref 4.0–10.5)

## 2016-08-18 NOTE — Progress Notes (Signed)
Late Entry Note - Patient seen and examined this AM  Post Partum Day 1 Subjective: no complaints, up ad lib, voiding, tolerating PO, + flatus and breast feeding  Objective: Blood pressure 118/73, pulse 84, temperature 98.3 F (36.8 C), temperature source Oral, resp. rate 19, height 5\' 1"  (1.549 m), weight 95.3 kg (210 lb), last menstrual period 11/21/2015, SpO2 100 %.  Physical Exam:  General: alert, cooperative and no distress Lochia: appropriate Uterine Fundus: firm perineum: healing well DVT Evaluation: No evidence of DVT seen on physical exam. Negative Homan's sign. No cords or calf tenderness.   Recent Labs  08/17/16 0552 08/18/16 0519  HGB 14.0 11.7*  HCT 40.5 34.6*    Assessment/Plan: Plan for discharge tomorrow, Breastfeeding and Circumcision prior to discharge   LOS: 1 day   Sherill Mangen STACIA 08/18/2016, 10:15 PM

## 2016-08-19 MED ORDER — IBUPROFEN 600 MG PO TABS: 600.0000 mg | ORAL_TABLET | Freq: Four times a day (QID) | ORAL | 0 refills | Status: DC

## 2016-08-19 NOTE — Progress Notes (Signed)
Patient is doing well.  She is ambulating, voiding, tolerating PO.  Pain control is good.  Lochia is appropriate  Vitals:   08/17/16 2100 08/18/16 0530 08/18/16 1828 08/19/16 0619  BP: 123/78 126/78 118/73 112/73  Pulse: 98 81 84 62  Resp: 16 18 19 18   Temp: 98.2 F (36.8 C) 97.7 F (36.5 C) 98.3 F (36.8 C) 97.6 F (36.4 C)  TempSrc: Oral Oral Oral Oral  SpO2:   100% 100%  Weight:      Height:        NAD Fundus firm Ext:   Lab Results  Component Value Date   WBC 12.4 (H) 08/18/2016   HGB 11.7 (L) 08/18/2016   HCT 34.6 (L) 08/18/2016   MCV 92.5 08/18/2016   PLT 151 08/18/2016    --/--/O POS (07/04 0600)/RI  A/P 30 y.o. G2P1001 PPD#2. Routine care.   Expect d/c today.    Delta Memorial HospitalDYANNA GEFFEL [employer] Tim[employer] CompanyCLARK

## 2016-08-19 NOTE — Discharge Summary (Signed)
Obstetric Discharge Summary Reason for Admission: onset of labor Prenatal Procedures: none Intrapartum Procedures: spontaneous vaginal delivery Postpartum Procedures: none Complications-Operative and Postpartum: 2 degree perineal laceration Hemoglobin  Date Value Ref Range Status  08/18/2016 11.7 (L) 12.0 - 15.0 g/dL Final   HCT  Date Value Ref Range Status  08/18/2016 34.6 (L) 36.0 - 46.0 % Final    Physical Exam:  General: alert, cooperative and appears stated age 46Lochia: appropriate Uterine Fundus: firm DVT Evaluation: No evidence of DVT seen on physical exam.  Discharge Diagnoses: Term Pregnancy-delivered  Discharge Information: Date: 08/19/2016 Activity: pelvic rest Diet: routine Medications: PNV and Ibuprofen Condition: stable Instructions: refer to practice specific booklet Discharge to: home Follow-up Information    Waynard Reedsoss, Kendra, MD Follow up in 4 week(s).   Specialty:  Obstetrics and Gynecology Contact information: [address] [place] [id number] [phone]           Newborn Data: Live born female  Birth Weight: 6 lb 14.8 oz (3140 g) APGAR: 9, 9  Home with mother.  Drew Herman GEFFEL Jarryn Altland 08/19/2016, 8:44 AM

## 2016-10-21 ENCOUNTER — Ambulatory Visit: Payer: BC Managed Care – PPO | Admitting: Family Medicine

## 2016-10-21 ENCOUNTER — Ambulatory Visit (HOSPITAL_BASED_OUTPATIENT_CLINIC_OR_DEPARTMENT_OTHER)
Admission: RE | Admit: 2016-10-21 | Discharge: 2016-10-21 | Disposition: A | Discharge status: Home / Self Care | Payer: BC Managed Care – PPO | Source: Ambulatory Visit | Attending: Family Medicine | Admitting: Family Medicine

## 2016-10-21 ENCOUNTER — Ambulatory Visit (INDEPENDENT_AMBULATORY_CARE_PROVIDER_SITE_OTHER): Payer: BC Managed Care – PPO | Admitting: Family Medicine

## 2016-10-21 ENCOUNTER — Encounter: Payer: Self-pay | Admitting: Family Medicine

## 2016-10-21 VITALS — BP 120/84 | HR 90 | Temp 98.2°F | Resp 16 | Ht 62.0 in | Wt 202.8 lb

## 2016-10-21 DIAGNOSIS — M79604 Pain in right leg: Secondary | ICD-10-CM

## 2016-10-21 NOTE — Progress Notes (Signed)
OFFICE VISIT  10/21/2016   CC:  Chief Complaint  Patient presents with  . Leg Pain    right   HPI:    Patient is a 31 y.o. Caucasian female who presents for right leg pain. Towards end of pregnancy she had R>L leg swelling in addition to pain down R leg from thigh to lower calf. Right LE venous doppler was done and was neg for DVT.   After giving birth, things got better for about 3 weeks.  Right leg has now been hurting again for 4 weeks from thigh to knee, rare extension down to calf or ankle.  Feels a "dull fullness" and "achy stiffness".  Minimal swelling. Pain seems to be the worst behind R knee.  Worse when walking and when bending/flexing R leg. NO SOB, CP, fever. She is not on oral contraceptive.  She has still been active, perhaps with a bit more sitting than usual. No personal or FH of blood clots. She does not smoke.  Had NSVD 08/2016 --female.  NSVD 08/2016.  Past Medical History:  Diagnosis Date  . ALLERGIC RHINITIS   . Asthma   . Blood type O+ 01/17/2012   Done by OB/GYN  . Family history of colon cancer    2 second degree relatives: needs to start screening colonoscopies at age 60 per GI.  Marland Kitchen GERD (gastroesophageal reflux disease)    not on daily PPI anymore  . IBS (irritable bowel syndrome) 2017  . Leg pain 06/2016; 10/2016   06/2016 Bilat. R LE doppler--  No DVT.  10/2016, R leg--R LE doppler--NO DVT.  Marland Kitchen Obesity, Class II, BMI 35-39.9   . Psoriasis    vs dyshidrotic dermatitis per latest eval by Dr. Terri Piedra 08/15/13  . Recurrent UTI 2012/13    Past Surgical History:  Procedure Laterality Date  . NO PAST SURGERIES      Outpatient Medications Prior to Visit  Medication Sig Dispense Refill  . acetaminophen (TYLENOL) 500 MG tablet Take 1,000 mg by mouth every 6 (six) hours as needed for headache.    . clobetasol ointment (TEMOVATE) 0.05 % Apply 1 application topically at bedtime as needed (psoriasis). 30 g 1  . ibuprofen (ADVIL,MOTRIN) 600 MG tablet Take 1 tablet  (600 mg total) by mouth every 6 (six) hours. 40 tablet 0  . Prenatal Vit-Fe Fumarate-FA (PRENATAL VITAMIN PO) Take 1 tablet by mouth every morning.     . ranitidine (ZANTAC) 150 MG tablet Take 1 tablet (150 mg total) by mouth 2 (two) times daily as needed for heartburn. 60 tablet 2  . Simethicone (GAS-X PO) Take 1 tablet by mouth daily as needed (digestion).     No facility-administered medications prior to visit.     Allergies  Allergen Reactions  . Latex Rash  . Zithromax [Azithromycin] Rash    ROS As per HPI  PE: Blood pressure 120/84, pulse 90, temperature 98.2 F (36.8 C), temperature source Oral, resp. rate 16, height  (1.575 m), weight 202 lb 12 oz (92 kg), last menstrual period 10/09/2016, SpO2 99 %, unknown if currently breastfeeding.Pt is NOT currently breast feeding. Pt examined with Wallace Keller, CMA, as chaperone.  Gen: Alert, well appearing.  Patient is oriented to person, place, time, and situation. AFFECT: pleasant, lucid thought and speech. CV: RRR, no m/r/g.   LUNGS: CTA bilat, nonlabored resps, good aeration in all lung fields. Right leg: no erythema or warmth.  No palpable nodule or cord.  Mild TTP over distal hamstring on  R, also in R popliteal fossa.  Mild prepatellar and peripatellar tenderness diffusely.  No fluctuance or erythema or warmth. No pitting edema. R calf circumference 10 cm below inf border of patella= 41 3/4 cm L is 42 3/4 cm.  LABS:  none  IMPRESSION AND PLAN:  Right leg pain, subacute. Pt is 2 mo postpartum, a bit relatively immobile compared to when pregnant and before. Will obtain LE venous doppler u/s to r/o DVT today. Suspect that if DVT is not found, this is soft tissue/muscle pain and it will just need time to resolve with stretching, heat, massage, and possibly otc topical lidocaine patches.  An After Visit Summary was printed and given to the patient.  Spent 25 min with pt today, with >50% of this time spent in counseling  and care coordination regarding the above problems.  FOLLOW UP: Return for f/u to be determined based on result of w/u.  Signed:  Santiago BumpersPhil Kielyn Kardell, MD           10/21/2016  ADDENDUM 5:25 pm on 10/21/16---venous doppler of R LE showed NO DVT. Reassured pt.  Discussed the above noted measures regarding soft tissue/muscle pain therapy.  Signed:  Santiago BumpersPhil Francois Elk, MD           10/21/2016

## 2016-11-21 ENCOUNTER — Encounter: Payer: Self-pay | Admitting: Family Medicine

## 2016-11-21 ENCOUNTER — Ambulatory Visit (INDEPENDENT_AMBULATORY_CARE_PROVIDER_SITE_OTHER): Payer: BC Managed Care – PPO | Admitting: Family Medicine

## 2016-11-21 VITALS — BP 118/84 | HR 100 | Temp 98.6°F | Resp 20 | Wt 205.0 lb

## 2016-11-21 DIAGNOSIS — J0100 Acute maxillary sinusitis, unspecified: Secondary | ICD-10-CM

## 2016-11-21 MED ORDER — AMOXICILLIN-POT CLAVULANATE 875-125 MG PO TABS: 1.0000 | ORAL_TABLET | Freq: Two times a day (BID) | ORAL | 0 refills | Status: DC

## 2016-11-21 NOTE — Patient Instructions (Signed)
Rest, hydrate.  +/- flonase, mucinex (DM if cough), nettie pot or nasal saline.  Augmentin prescribed, take until completed.  If cough present it can last up to 6-8 weeks.  F/U 2 weeks of not improved.    Sinus Headache A sinus headache happens when your sinuses become clogged or swollen. You may feel pain or pressure in your face, forehead, ears, or upper teeth. Sinus headaches can be mild or severe. Follow these instructions at home:  Take medicines only as told by your doctor.  If you were given an antibiotic medicine, finish all of it even if you start to feel better.  Use a nose spray if you feel stuffed up (congested).  If told, apply a warm, moist washcloth to your face to help lessen pain. Contact a doctor if:  You get headaches more than one time each week.  Light or sound bothers you.  You have a fever.  You feel sick to your stomach (nauseous) or you throw up (vomit).  Your headaches do not get better with treatment. Get help right away if:  You have trouble seeing.  You suddenly have very bad pain in your face or head.  You start to twitch or shake (seizure).  You are confused.  You have a stiff neck. This information is not intended to replace advice given to you by your health care provider. Make sure you discuss any questions you have with your health care provider. Document Released: 06/02/2010 Document Revised: 09/27/2015 Document Reviewed: 01/27/2014 Elsevier Interactive Patient Education  [employer].

## 2016-11-21 NOTE — Progress Notes (Signed)
Leslie Beck , 03/05/1985, 31 y.o., female MRN: [id number] Patient Care Team    Relationship Specialty Notifications Start End  McGowen, Maryjean Morn, MD PCP - General Family Medicine  07/28/11   Carrington Clamp, MD Consulting Physician Obstetrics and Gynecology  08/08/11   Cherlyn Roberts, MD Consulting Physician Dermatology  08/19/13   Waynard Reeds, MD Consulting Physician Obstetrics and Gynecology  11/14/14   Essie Hart, MD Consulting Physician Obstetrics and Gynecology  12/04/15   Beverley Fiedler, MD Consulting Physician Gastroenterology  01/12/16     Chief Complaint  Patient presents with  . URI     productive cough,congestion,facial pressure x 5 days     Subjective: Pt presents for an OV with complaints of Sinus drainage and sinus pressure of 5 days duration.  Associated symptoms include mild sore throat, hoarseness, nasal congestion, cough that started yesterday and is productive. Patient reports her children have also been sick. She denies fever, chills, nausea, vomit, diarrhea or rash. She reports she thought she was starting to feel better until today with increased sputum production and cough. Pt has tried Mucinex to ease their symptoms.   No flowsheet data found.  Allergies  Allergen Reactions  . Latex Rash  . Zithromax [Azithromycin] Rash   Social History  Substance Use Topics  . Smoking status: Never Smoker  . Smokeless tobacco: Never Used  . Alcohol use No   Past Medical History:  Diagnosis Date  . ALLERGIC RHINITIS   . Asthma   . Blood type O+ 01/17/2012   Done by OB/GYN  . Family history of colon cancer    2 second degree relatives: needs to start screening colonoscopies at age 8 per GI.  Marland Kitchen GERD (gastroesophageal reflux disease)    not on daily PPI anymore  . IBS (irritable bowel syndrome) 2017  . Leg pain 06/2016; 10/2016   06/2016 Bilat. R LE doppler--  No DVT.  10/2016, R leg--R LE doppler--NO DVT.  Marland Kitchen Obesity, Class II, BMI 35-39.9   . Psoriasis    vs dyshidrotic dermatitis per latest eval by Dr. Terri Piedra 08/15/13  . Recurrent UTI 2012/13   Past Surgical History:  Procedure Laterality Date  . NO PAST SURGERIES     Family History  Problem Relation Age of Onset  . Hypertension Mother   . Hypothyroidism Mother   . Other Mother        varicose veins  . Hypertension Father   . Colon cancer Maternal Grandfather   . Colon cancer Paternal Grandmother    Allergies as of 11/21/2016      Reactions   Latex Rash   Zithromax [azithromycin] Rash      Medication List       Accurate as of 11/21/16 10:51 AM. Always use your most recent med list.          acetaminophen 500 MG tablet Commonly known as:  TYLENOL Take 1,000 mg by mouth every 6 (six) hours as needed for headache.   clobetasol ointment 0.05 % Commonly known as:  TEMOVATE Apply 1 application topically at bedtime as needed (psoriasis).   GAS-X PO Take 1 tablet by mouth daily as needed (digestion).   ibuprofen 600 MG tablet Commonly known as:  ADVIL,MOTRIN Take 1 tablet (600 mg total) by mouth every 6 (six) hours.   PRENATAL VITAMIN PO Take 1 tablet by mouth every morning.   ranitidine 150 MG tablet Commonly known as:  ZANTAC Take 1 tablet (150 mg total) by mouth 2 (  two) times daily as needed for heartburn.       All past medical history, surgical history, allergies, family history, immunizations andmedications were updated in the EMR today and reviewed under the history and medication portions of their EMR.     ROS: Negative, with the exception of above mentioned in HPI   Objective:  BP 118/84 (BP Location: Left Arm, Patient Position: Sitting, Cuff Size: Large)   Pulse 100   Temp 98.6 F (37 C)   Resp 20   Wt 205 lb (93 kg)   LMP 11/06/2016   SpO2 98%   BMI 37.49 kg/m  Body mass index is 37.49 kg/m. Gen: Afebrile. No acute distress. Nontoxic in appearance, well developed, well nourished.  HENT: AT. [place]. Bilateral TM visualized With mild erythema and  fullness bilaterally. MMM, no oral lesions. Bilateral nares erythema and drainage noted. Throat without erythema or exudates. Cough present, hoarseness present. Tenderness to palpation maxillary sinus. Eyes:Pupils Equal Round Reactive to light, Extraocular movements intact,  Conjunctiva without redness, discharge or icterus. Neck/lymp/endocrine: Supple, mild anterior cervical lymphadenopathy CV: RRR  Chest: CTAB, no wheeze or crackles. Good air movement, normal resp effort.  Abd: Soft. NTND. BS present.  Skin: No rashes, purpura or petechiae.    No exam data present No results found. No results found for this or any previous visit (from the past 24 hour(s)).  Assessment/Plan: Leslie Beck is a 31 y.o. female present for OV for  1. Acute maxillary sinusitis, recurrence not specified Rest, hydrate.  +/- flonase, mucinex (DM if cough), nettie pot or nasal saline.  augmentin prescribed, take until completed. Patient has taken Augmentin make as before without reaction. She did have a rash with amoxicillin a few months ago, but she does not think it was related to the medication. She would prefer to try Augmentin again over the use of doxycycline.  If cough present it can last up to 6-8 weeks.  F/U 2 weeks of not improved.    Reviewed expectations re: course of current medical issues.  Discussed self-management of symptoms.  Outlined signs and symptoms indicating need for more acute intervention.  Patient verbalized understanding and all questions were answered.  Patient received an After-Visit Summary.    No orders of the defined types were placed in this encounter.    Note is dictated utilizing voice recognition software. Although note has been proof read prior to signing, occasional typographical errors still can be missed. If any questions arise, please do not hesitate to call for verification.   electronically signed by:  Felix Pacini, DO  [facility] -  OR

## 2017-09-26 ENCOUNTER — Encounter: Payer: Self-pay | Admitting: *Deleted

## 2017-09-26 DIAGNOSIS — O139 Gestational [pregnancy-induced] hypertension without significant proteinuria, unspecified trimester: Secondary | ICD-10-CM | POA: Insufficient documentation

## 2017-10-14 IMAGING — US US MFM OB DETAIL+14 WK
1 series · 14 of 28 positions shown · non-contrast
Comparison: none

[Series 1: us mfm ob detail+14 wk · 77 acquisitions, 14 frames shown]
[im 3/77]
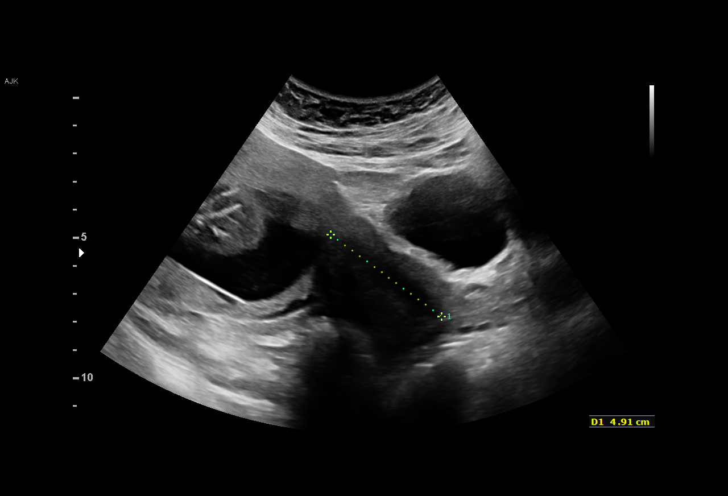
[im 9/77]
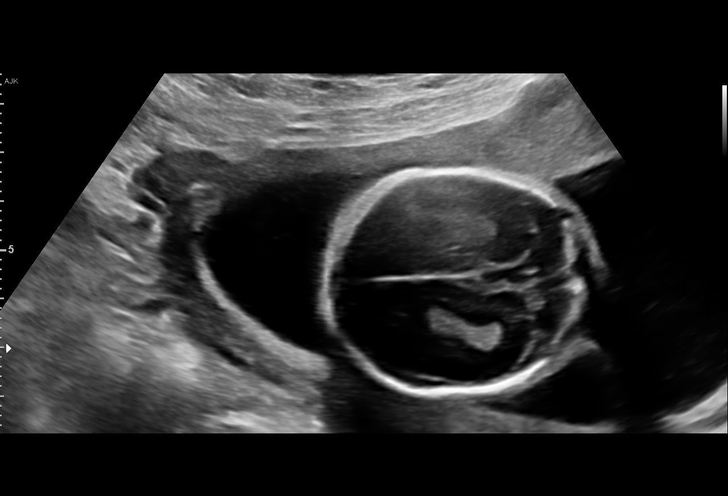
[im 15/77]
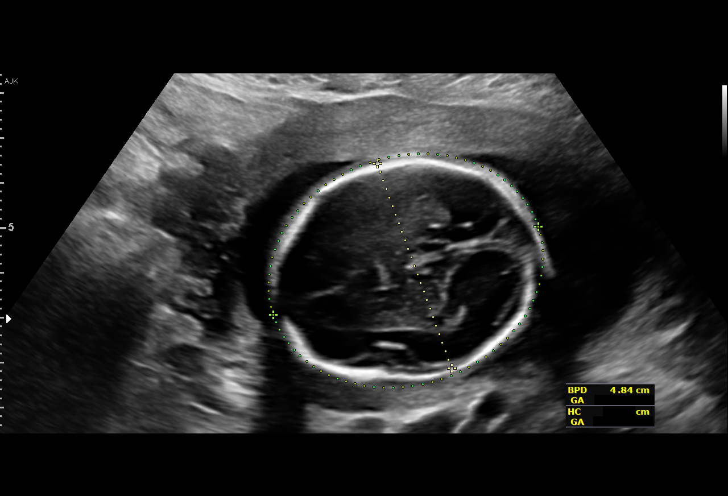
[im 20/77]
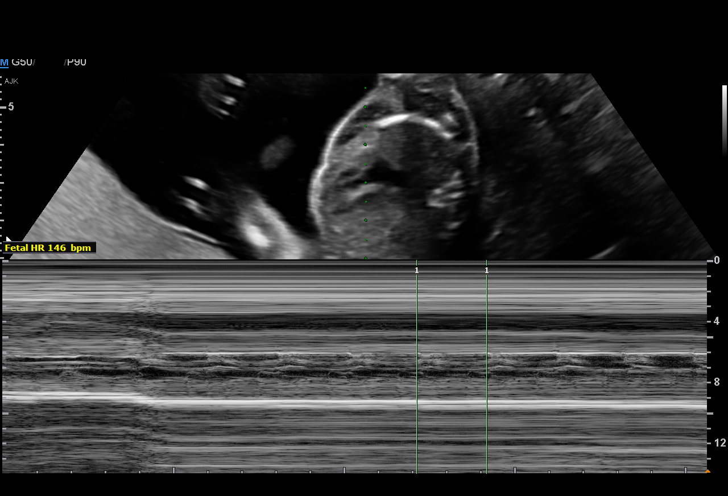
[im 26/77]
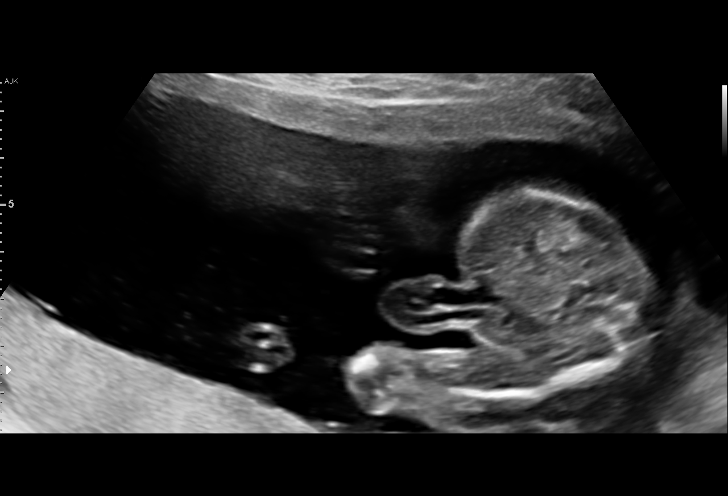
[im 31/77]
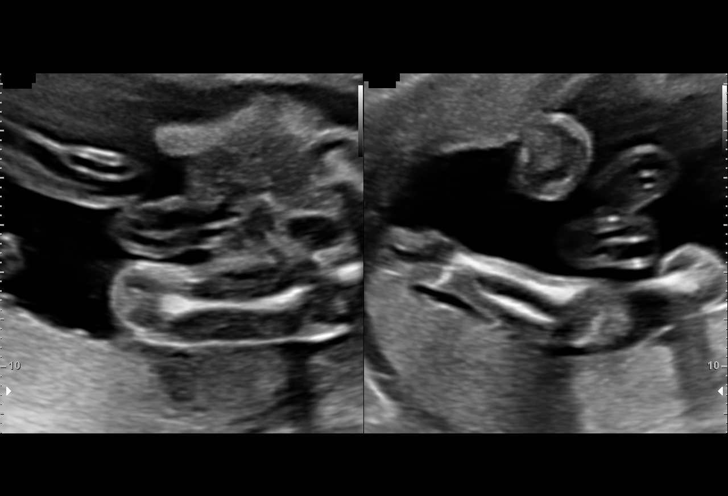
[im 37/77]
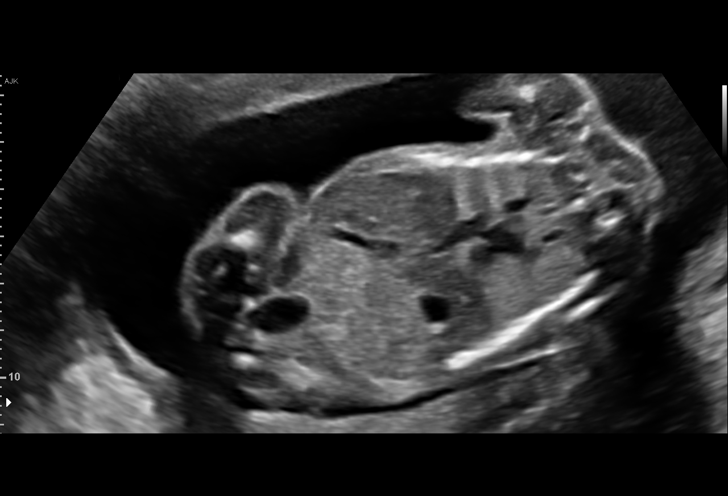
[im 43/77]
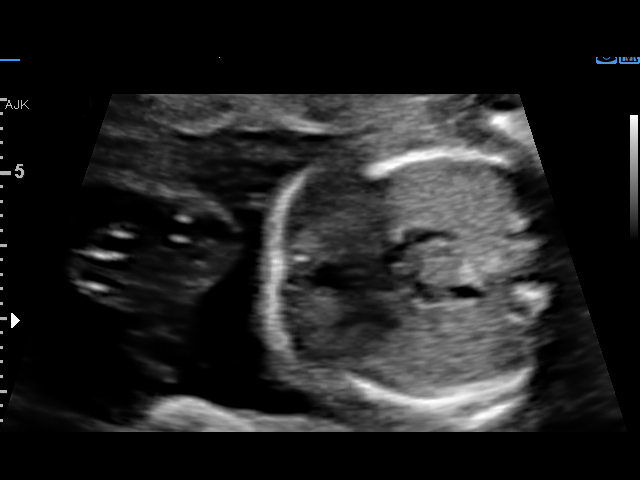
[im 48/77]
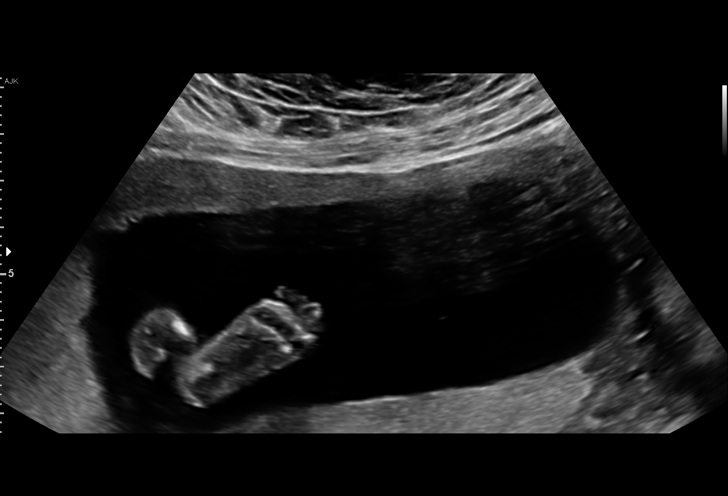
[im 54/77]
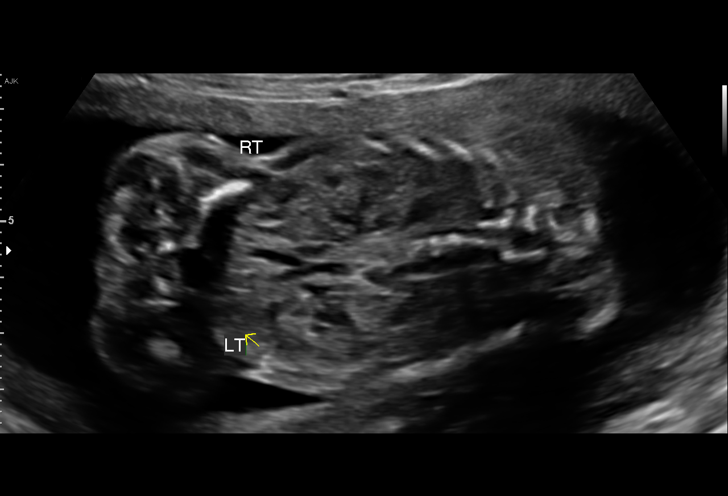
[im 60/77]
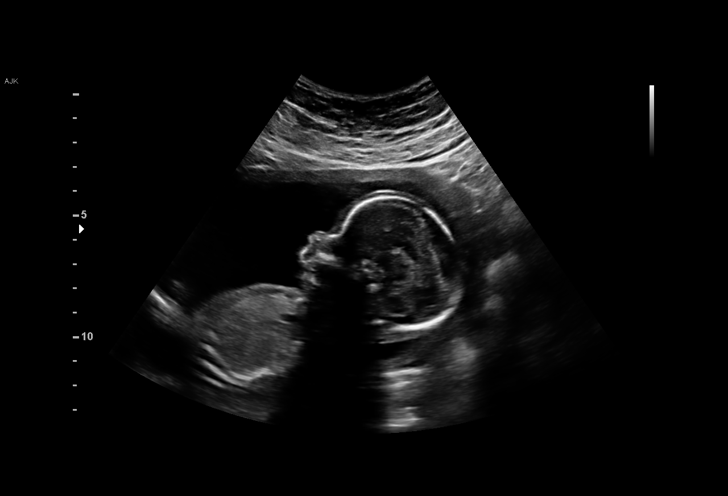
[im 65/77]
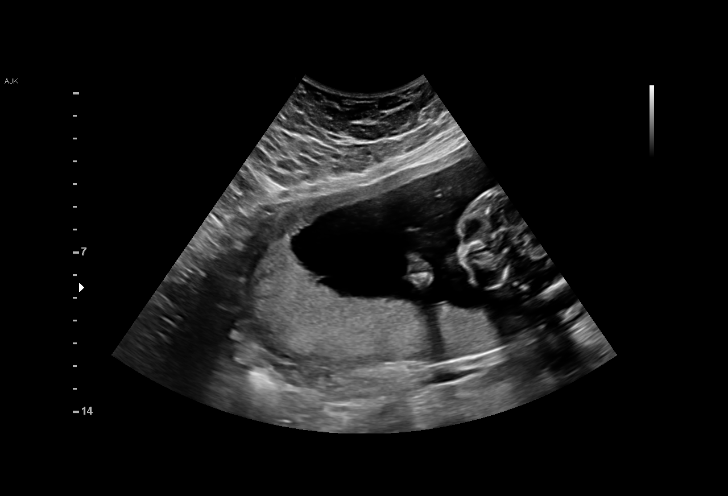
[im 71/77]
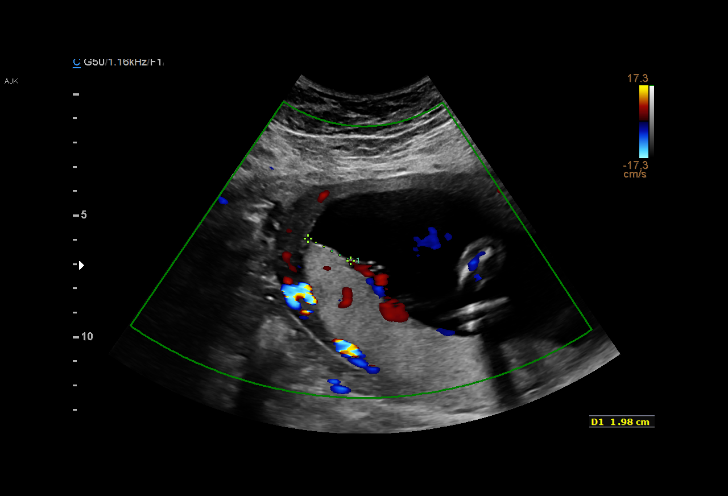
[im 77/77]
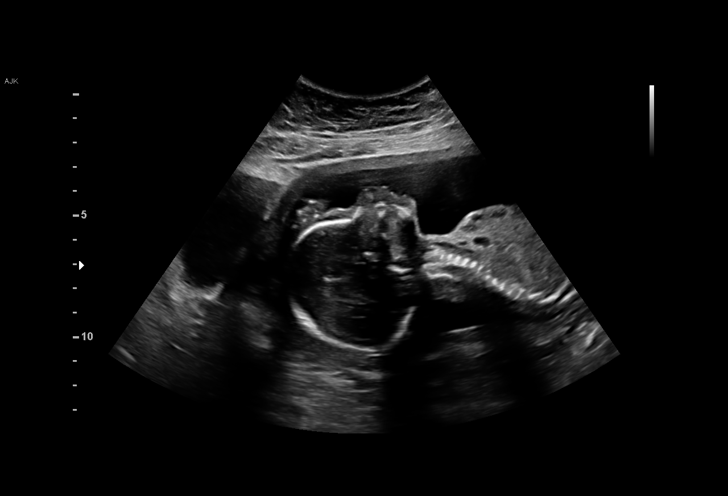

[14 of 28 positions shown; findings below may reference images not displayed]

1  [facility]              [id number]      [id number]     [id number]
Indications

20 weeks gestation of pregnancy
Encounter for fetal anatomic survey
Obesity complicating pregnancy, second
trimester
Abnormal fetal ultrasound
Echogenic bowel
OB History

Blood Type:            Height:  5'2"   Weight (lb):  192       BMI:
Gravidity:    2         Term:   1
Fetal Evaluation

Num Of Fetuses:     1
Fetal Heart         146
Rate(bpm):
Cardiac Activity:   Observed
Presentation:       Cephalic
Placenta:           Posterior, above cervical os
P. Cord Insertion:  Marginal insertion

Amniotic Fluid
AFI FV:      Subjectively within normal limits

Largest Pocket(cm)
6.98
Biometry

BPD:      47.6  mm     G. Age:  20w 3d         48  %    CI:        74.24   %    70 - 86
FL/HC:      19.3   %    16.8 -
HC:      175.4  mm     G. Age:  20w 0d         25  %    HC/AC:      1.06        1.09 -
AC:      165.5  mm     G. Age:  21w 4d         79  %    FL/BPD:     71.0   %
FL:       33.8  mm     G. Age:  20w 4d         48  %    FL/AC:      20.4   %    20 - 24
CER:      20.9  mm     G. Age:  19w 6d         38  %
NFT:       5.1  mm

CM:        3.9  mm

Est. FW:     392  gm    0 lb 14 oz      55  %
Gestational Age

LMP:           20w 3d        Date:  11/21/15                 EDD:   08/27/16
U/S Today:     20w 5d                                        EDD:   08/25/16
Best:          20w 3d     Det. By:  LMP  (11/21/15)          EDD:   08/27/16
Anatomy

Cranium:               Appears normal         Aortic Arch:            Appears normal
Cavum:                 Appears normal         Ductal Arch:            Appears normal
Ventricles:            Appears normal         Diaphragm:              Appears normal
Choroid Plexus:        Appears normal         Stomach:                Appears normal, left
sided
Cerebellum:            Appears normal         Abdomen:                Appears normal
Posterior Fossa:       Appears normal         Abdominal Wall:         Appears nml (cord
insert, abd wall)
Nuchal Fold:           Appears normal         Cord Vessels:           Appears normal (3
vessel cord)
Face:                  Appears normal         Kidneys:                Appear normal
(orbits and profile)
Lips:                  Appears normal         Bladder:                Appears normal
Thoracic:              Appears normal         Spine:                  Not well visualized
Heart:                 Appears normal         Upper Extremities:      Appears normal
(4CH, axis, and situs
RVOT:                  Appears normal         Lower Extremities:      Appears normal
LVOT:                  Appears normal

Other:  Fetus appears to be a male. Nasal bone visualized. Technically
difficult due to maternal habitus and fetal position.
Cervix Uterus Adnexa

Cervix
Length:           4.85  cm.
Normal appearance by transabdominal scan.

Uterus
No abnormality visualized.

Left Ovary
Within normal limits.

Right Ovary
Within normal limits.

Adnexa:       No abnormality visualized.
Impression

Singleton intrauterine pregnancy at 20+3 weeks with
suspicion of echogenic bowel and no visible stomach bubble
Review of the anatomy shows no sonographic markers for
aneuploidy or structural anomalies
Stomach is well visualized and normal. No echogenic bowel
is appreciated
Amniotic fluid volume is normal
Estimated fetal weight is 392g which is growth in the 55th
percentile
Recommendations

No evidence of any structural abnormalities. Follow-up
ultrasounds as clinically indicated.

## 2018-04-13 LAB — HM PAP SMEAR: HM Pap smear: NORMAL

## 2018-10-18 IMAGING — US US ABDOMEN COMPLETE
1 series · 14 of 25 positions shown · non-contrast
Comparison: None in PACs

CLINICAL DATA: Dyspepsia for several weeks, history of
gastroesophageal reflux.

EXAM:
ABDOMEN ULTRASOUND COMPLETE

[Series 1: us abdomen complete · 0.22mm/px · 14 of 106 slices shown]
[im 1/106]
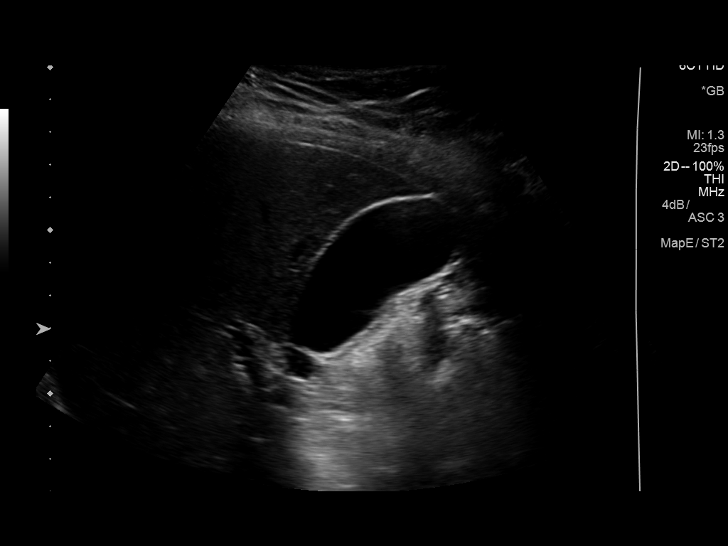
[im 9/106]
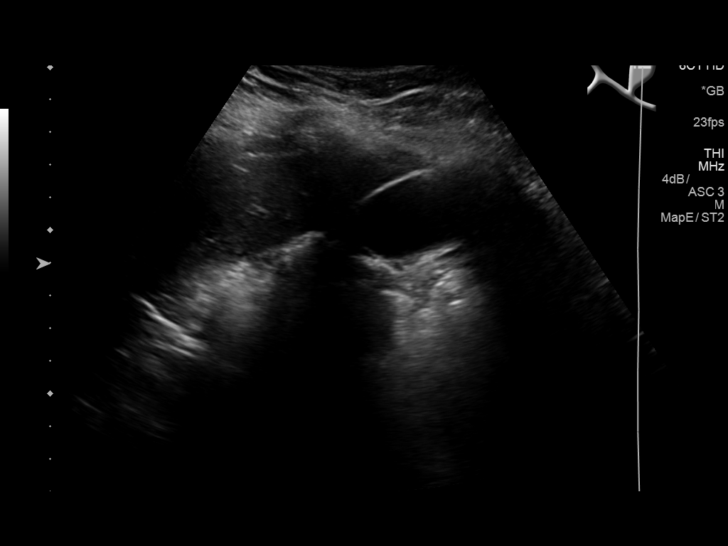
[im 18/106]
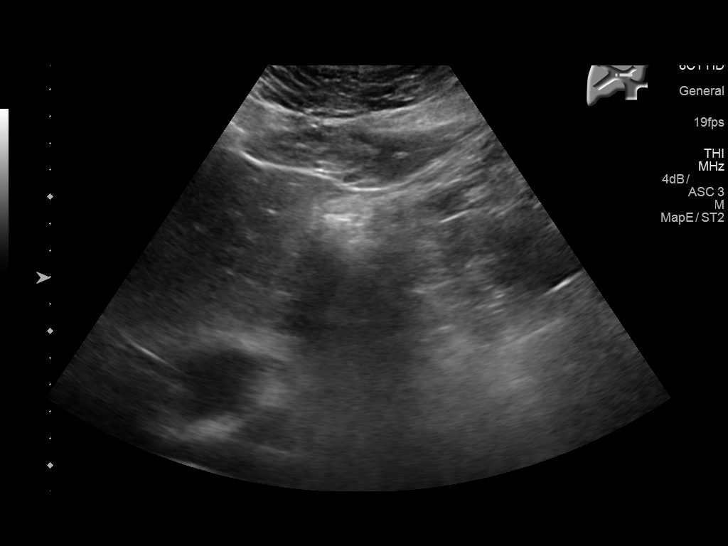
[im 27/106]
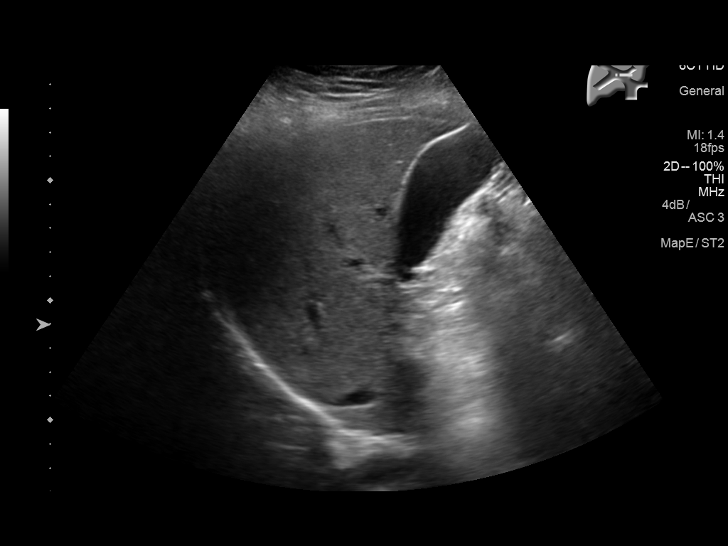
[im 36/106]
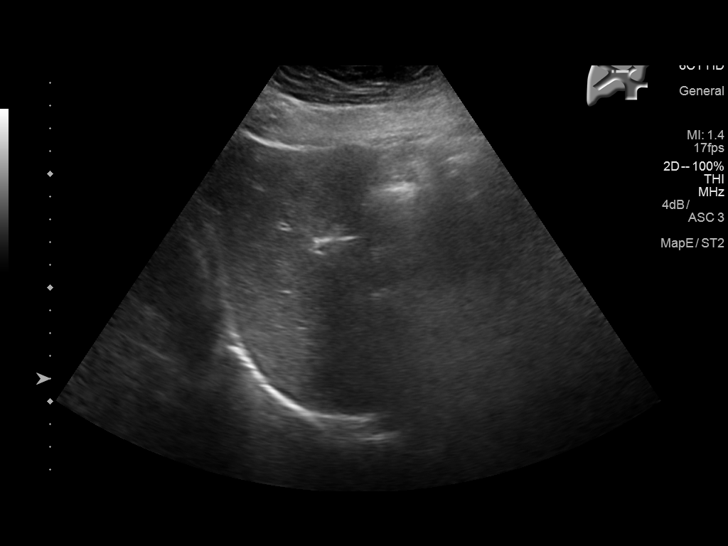
[im 40/106]
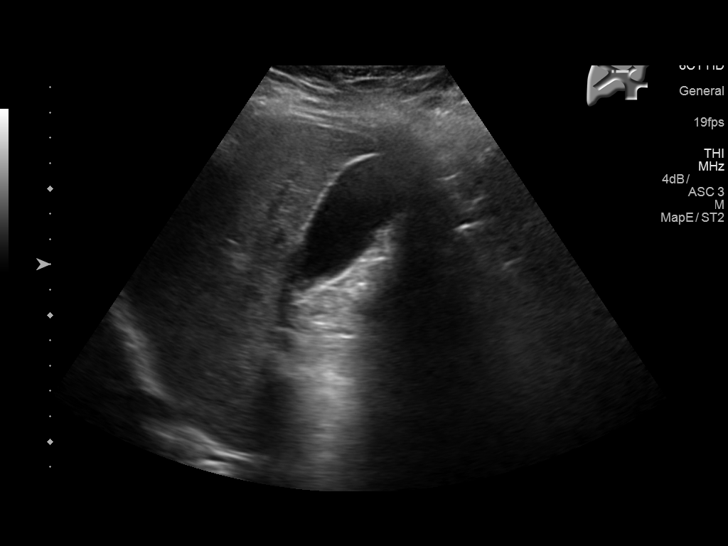
[im 49/106]
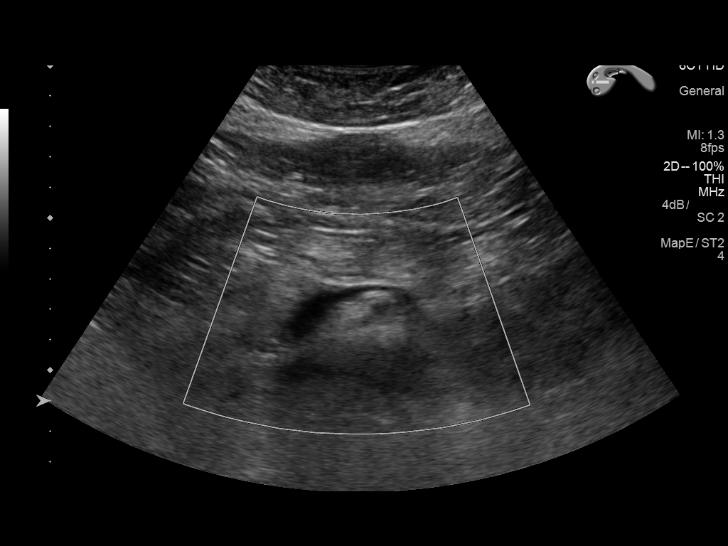
[im 57/106]
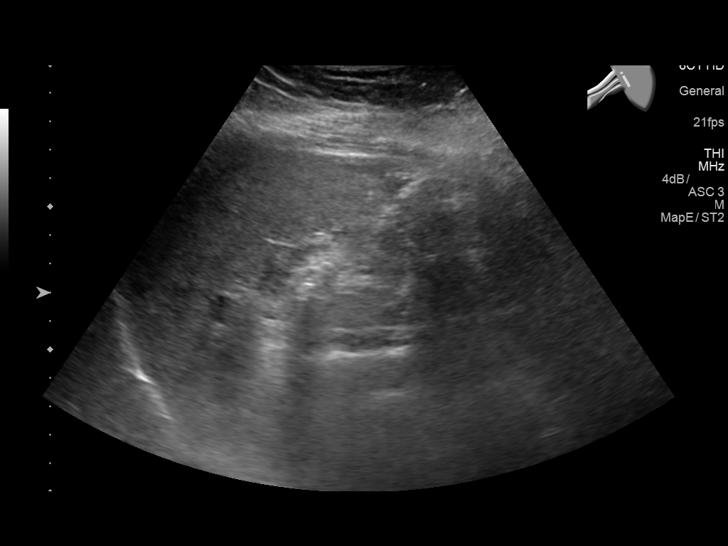
[im 66/106]
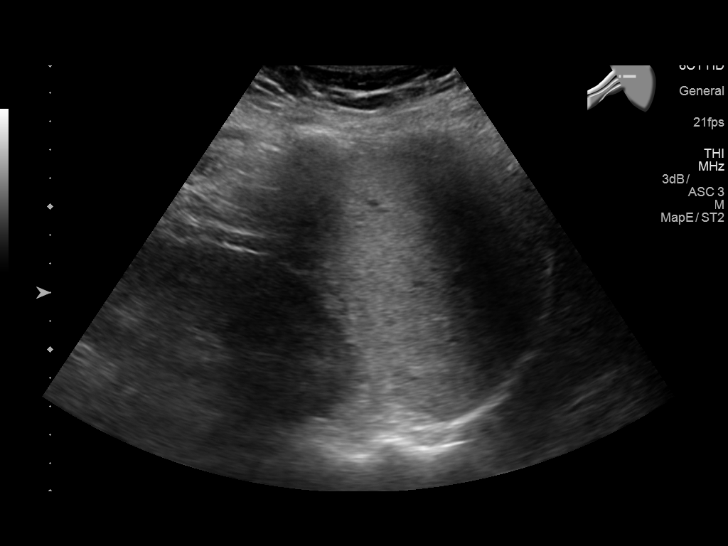
[im 71/106]
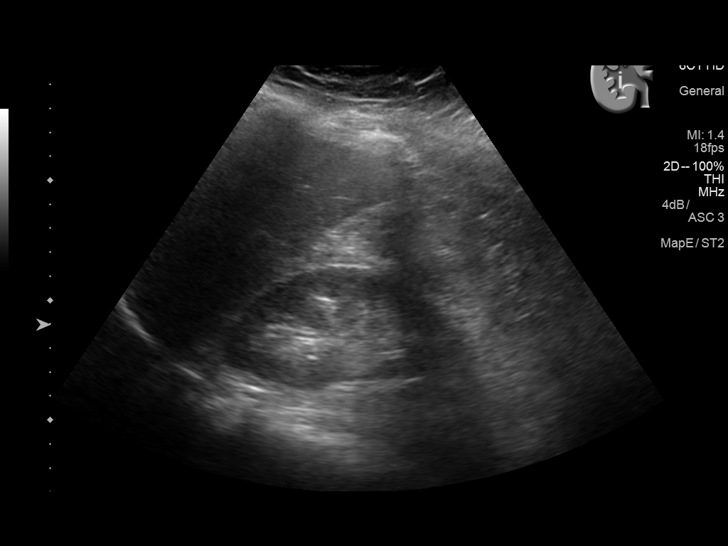
[im 79/106]
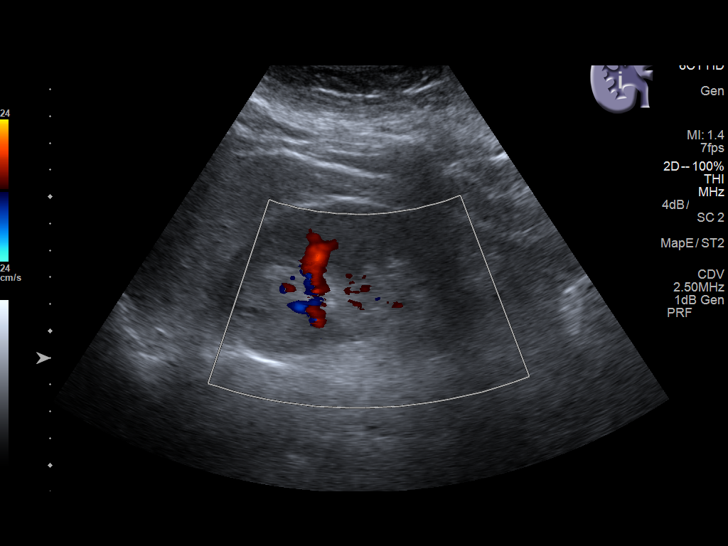
[im 88/106]
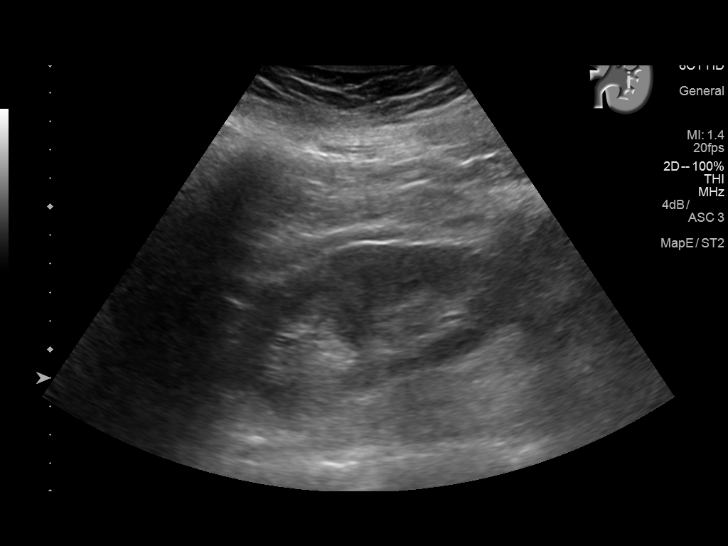
[im 97/106]
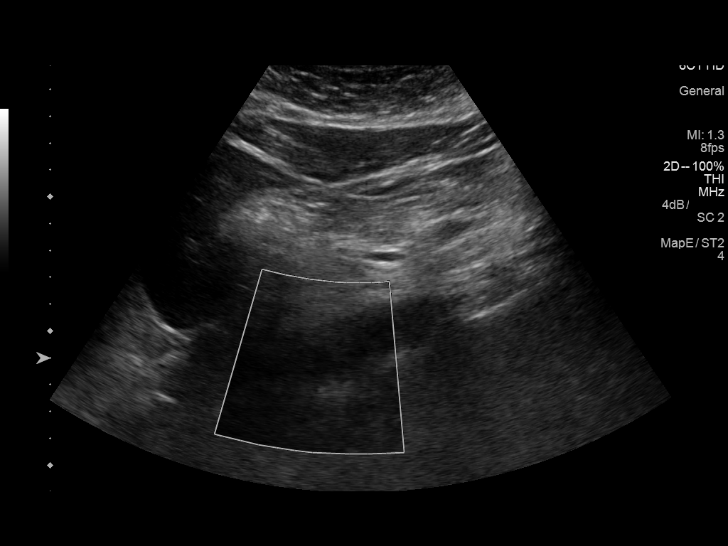
[im 106/106]
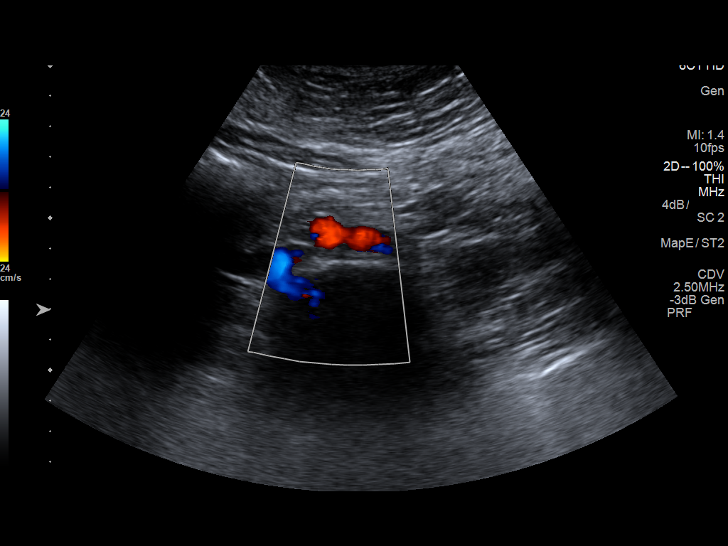

[14 of 25 positions shown; findings below may reference images not displayed]

FINDINGS: Gallbladder: No gallstones or wall thickening visualized. No
sonographic Murphy sign noted by sonographer.

Common bile duct: Diameter: 4.5 mm

Liver: No focal lesion identified. Within normal limits in
parenchymal echogenicity.

IVC: Bowel gas limits evaluation of the IVC.

Pancreas: Bowel gas limits evaluation of the pancreatic head and
tail. The pancreatic body appears normal.

Spleen: Size and appearance within normal limits.

Right Kidney: Length: 10.3 cm. Echogenicity within normal limits. No
mass or hydronephrosis visualized.

Left Kidney: Length: 10.5 cm. Echogenicity within normal limits. No
mass or hydronephrosis visualized.

Abdominal aorta: No aneurysm visualized.

Other findings: There is no ascites.
IMPRESSION: No gallstones or sonographic evidence of acute cholecystitis. If
there are clinical concerns of gallbladder dysfunction, a nuclear
medicine hepatobiliary scan with gallbladder ejection fraction
determination may be useful.

No acute intra-abdominal abnormality is observed.

## 2019-01-04 ENCOUNTER — Other Ambulatory Visit: Payer: Self-pay

## 2019-01-04 DIAGNOSIS — Z20822 Contact with and (suspected) exposure to covid-19: Secondary | ICD-10-CM

## 2019-01-07 LAB — NOVEL CORONAVIRUS, NAA: SARS-CoV-2, NAA: NOT DETECTED

## 2019-01-29 DIAGNOSIS — N83209 Unspecified ovarian cyst, unspecified side: Secondary | ICD-10-CM | POA: Insufficient documentation

## 2019-01-31 ENCOUNTER — Encounter: Payer: Self-pay | Admitting: Physician Assistant

## 2019-02-21 ENCOUNTER — Encounter: Payer: Self-pay | Admitting: Physician Assistant

## 2019-02-21 ENCOUNTER — Ambulatory Visit: Payer: BC Managed Care – PPO | Admitting: Physician Assistant

## 2019-02-21 VITALS — BP 136/80 | HR 120 | Temp 98.1°F | Ht 61.25 in | Wt 203.4 lb

## 2019-02-21 DIAGNOSIS — R109 Unspecified abdominal pain: Secondary | ICD-10-CM

## 2019-02-21 DIAGNOSIS — K219 Gastro-esophageal reflux disease without esophagitis: Secondary | ICD-10-CM

## 2019-02-21 DIAGNOSIS — K589 Irritable bowel syndrome without diarrhea: Secondary | ICD-10-CM

## 2019-02-21 DIAGNOSIS — Z800 Family history of malignant neoplasm of digestive organs: Secondary | ICD-10-CM | POA: Diagnosis not present

## 2019-02-21 MED ORDER — OMEPRAZOLE 40 MG PO CPDR: 40.0000 mg | DELAYED_RELEASE_CAPSULE | Freq: Every day | ORAL | 2 refills | Status: DC

## 2019-02-21 NOTE — Patient Instructions (Addendum)
If you are age 34 or older, your body mass index should be between 23-30. Your Body mass index is 38.11 kg/m. If this is out of the aforementioned range listed, please consider follow up with your Primary Care Provider.  If you are age 45 or younger, your body mass index should be between 19-25. Your Body mass index is 38.11 kg/m. If this is out of the aformentioned range listed, please consider follow up with your Primary Care Provider.   Increase Omeprazole to 40 mg daily take 20-30 before breakfast.  Start Fiber supplement daily (Benefiber). Start Align daily for 2 months.  Call if no change or worsening symptoms and will proceed with CT scan of abdomen and pelvis.   Follow up in 4-6 weeks. Call office in 3 weeks to schedule appointment.

## 2019-02-21 NOTE — Progress Notes (Signed)
Chief Complaint: Irritable bowel syndrome  HPI:    Leslie Beck is a 34 year old female with a past medical history as listed below including a family history of colon cancer in 2 second-degree relatives, known to Dr. Rhea Belton, who was referred to me by McGowen, Maryjean Morn, MD for a complaint of IBS.      01/12/2016 office visit with Dr. Rhea Belton to discuss alternating diarrhea and constipation with abdominal pain.  It was discussed these are most consistent with irritable bowel.  Recommended she avoid PPI if possible during pregnancy.  Started on ranitidine 150 twice daily.  That time benefiting from the addition of a probiotic yogurt.  Also recommended starting fiber.  Celiac panel, CBC and CMP as well as ultrasound were ordered.  Was recommended she start screening for colon cancer at the age of 26.    01/29/2019 saw OB/GYN.  As noted the UA was negative but patient was given a Macrobid course.    Today, patient presents clinic and explains that a month ago she developed some low back pain that radiated into her right lower quadrant.  She thought that maybe this was a UTI and was started on antibiotic by her OB/GYN.  This antibiotic gave her terrible abdominal cramping so it was switched, she is unsure what type it was.  Tells me that she had very loose stool initially and now she is just "out of rhythm with my BMs".  Continues with this right-sided abdominal pain, did have an ultrasound which showed a possible hemorrhagic cyst on her right ovary.  A few days later she had her period and the soreness and pain got a lot better but her bowel movements have continued to be off and she continues with the pain in her right upper quadrant related to gas and bloating as well as the pain which radiates down from her right hip into her groin and around her right buttock.  Apparently her OB/GYN thought possibly she pulled a muscle when she described this.  Patient is just anxious given her family history of colon  cancer.  She is somewhat tearful at time of exam today.      Tells me she also is experiencing some burning in her stomach and gurgling.  Did start Omeprazole 20 mg daily, as this is what she was told to do a long time ago when she saw Dr. Rhea Belton, and feels like things are much much better, in fact she almost canceled today's appointment.    Denies fever, chills, blood in her stool, weight loss, anorexia, nausea, vomiting or symptoms that awaken her from sleep.  Past Medical History:  Diagnosis Date  . ALLERGIC RHINITIS   . Asthma   . Blood type O+ 01/17/2012   Done by OB/GYN  . Family history of colon cancer    2 second degree relatives: needs to start screening colonoscopies at age 16 per GI.  Marland Kitchen GERD (gastroesophageal reflux disease)    not on daily PPI anymore  . IBS (irritable bowel syndrome) 2017  . Leg pain 06/2016; 10/2016   06/2016 Bilat. R LE doppler--  No DVT.  10/2016, R leg--R LE doppler--NO DVT.  Marland Kitchen Obesity, Class II, BMI 35-39.9   . Psoriasis    vs dyshidrotic dermatitis per latest eval by Dr. Terri Piedra 08/15/13  . Recurrent UTI 2012/13    Past Surgical History:  Procedure Laterality Date  . NO PAST SURGERIES      Current Outpatient Medications  Medication Sig Dispense Refill  .  acetaminophen (TYLENOL) 500 MG tablet Take 1,000 mg by mouth every 6 (six) hours as needed for headache.    Marland Kitchen amoxicillin-clavulanate (AUGMENTIN) 875-125 MG tablet Take 1 tablet by mouth 2 (two) times daily. 20 tablet 0  . clobetasol ointment (TEMOVATE) 0.05 % Apply 1 application topically at bedtime as needed (psoriasis). 30 g 1  . ibuprofen (ADVIL,MOTRIN) 600 MG tablet Take 1 tablet (600 mg total) by mouth every 6 (six) hours. 40 tablet 0  . Prenatal Vit-Fe Fumarate-FA (PRENATAL VITAMIN PO) Take 1 tablet by mouth every morning.     . ranitidine (ZANTAC) 150 MG tablet Take 1 tablet (150 mg total) by mouth 2 (two) times daily as needed for heartburn. 60 tablet 2  . Simethicone (GAS-X PO) Take 1 tablet  by mouth daily as needed (digestion).     No current facility-administered medications for this visit.    Allergies as of 02/21/2019 - Review Complete 11/21/2016  Allergen Reaction Noted  . Latex Rash   . Zithromax [azithromycin] Rash 05/26/2010    Family History  Problem Relation Age of Onset  . Hypertension Mother   . Hypothyroidism Mother   . Other Mother        varicose veins  . Hypertension Father   . Colon cancer Maternal Grandfather   . Colon cancer Paternal Grandmother     Social History   Socioeconomic History  . Marital status: Married    Spouse name: Not on file  . Number of children: Not on file  . Years of education: Not on file  . Highest education level: Not on file  Occupational History  . Not on file  Tobacco Use  . Smoking status: Never Smoker  . Smokeless tobacco: Never Used  Substance and Sexual Activity  . Alcohol use: No  . Drug use: No  . Sexual activity: Yes    Partners: Male    Birth control/protection: None  Other Topics Concern  . Not on file  Social History Narrative   Married, one child.   Orig from [place], went to [employer].   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   No T/A/Ds.   Sees Dr. Henderson Cloud for GYN.   Social Determinants of Health   Financial Resource Strain:   . Difficulty of Paying Living Expenses: Not on file  Food Insecurity:   . Worried About Programme researcher, broadcasting/film/video in the Last Year: Not on file  . Ran Out of Food in the Last Year: Not on file  Transportation Needs:   . Lack of Transportation (Medical): Not on file  . Lack of Transportation (Non-Medical): Not on file  Physical Activity:   . Days of Exercise per Week: Not on file  . Minutes of Exercise per Session: Not on file  Stress:   . Feeling of Stress : Not on file  Social Connections:   . Frequency of Communication with Friends and Family: Not on file  . Frequency of Social Gatherings with Friends and Family: Not on file  . Attends Religious Services: Not on  file  . Active Member of Clubs or Organizations: Not on file  . Attends Banker Meetings: Not on file  . Marital Status: Not on file  Intimate Partner Violence:   . Fear of Current or Ex-Partner: Not on file  . Emotionally Abused: Not on file  . Physically Abused: Not on file  . Sexually Abused: Not on file    Review of Systems:    Constitutional: No weight  loss, fever or chills Cardiovascular: No chest pain Respiratory: No SOB Gastrointestinal: See HPI and otherwise negative Genitourinary: No dysuria  Neurological: No headache Musculoskeletal: No new muscle or joint pain Hematologic: No bleeding Psychiatric: No history of depression or anxiety   Physical Exam:  Vital signs: BP 136/80 (BP Location: Left Arm, Patient Position: Sitting, Cuff Size: Normal)   Pulse (!) 120   Temp 98.1 F (36.7 C)   Ht 5' 1.25" (1.556 m) Comment: height measured without shoes  Wt 203 lb 6 oz (92.3 kg)   LMP 02/01/2019   Breastfeeding No   BMI 38.11 kg/m   Constitutional:   Pleasant Caucasian female appears to be in NAD, Well developed, Well nourished, alert and cooperative Head:  Normocephalic and atraumatic. Eyes:   PEERL, EOMI. No icterus. Conjunctiva pink. Ears:  Normal auditory acuity. Neck:  Supple Throat: Oral cavity and pharynx without inflammation, swelling or lesion.  Respiratory: Respirations even and unlabored. Lungs clear to auscultation bilaterally.   No wheezes, crackles, or rhonchi.  Cardiovascular: Normal S1, S2. No MRG. Regular rate and rhythm. No peripheral edema, cyanosis or pallor.  Gastrointestinal:  Soft, nondistended, Mild RLQ ttp, No rebound or guarding. Normal bowel sounds. No appreciable masses or hepatomegaly. Rectal:  Not performed.  Msk:  Symmetrical without gross deformities. Without edema, no deformity or joint abnormality. Right hip pain when moving leg Neurologic:  Alert and  oriented x4;  grossly normal neurologically.  Skin:   Dry and intact  without significant lesions or rashes. Psychiatric:  Demonstrates good judgement and reason without abnormal affect or behaviors.  No recent labs/ imaging.  Assessment: 1.  IBS: With alternating diarrhea and constipation and bloating as well as some right-sided abdominal pain, most likely this is IBS, may have been worsened after recent antibiotic use given for UTI; could also be related to cyst seen on right ovary, patient has follow-up with OB/GYN in February 2.  GERD: Better on omeprazole 20 mg daily 3.  Right-sided abdominal pain: Consider IBS+/-hemorrhagic cyst+/-musculoskeletal etiology\ 4.  Family history of colon cancer: In 2 second-degree relatives, has been recommended she have her first colonoscopy at the age of 31 and when she develops symptoms sooner  Plan: 1.  Increased Omeprazole to 40 mg daily, 30- 60 minutes before breakfast.  Discussed with patient that this can be stopped or decreased at follow-up visit if she is better. 2.  Recommend the patient start a daily probiotic such as Align.  Provided her with samples of this today.  She should continue this for at least 1 to 2 months. 3.  Patient should start a fiber supplement such as Benefiber.  She has used this in the past and it worked well for her. 4.  Patient tells me she will call and let us know if she is not feeling any better or if symptoms get worse.  At that point recommend she have a CT of the abdomen/ pelvis with contrast for further evaluation. 5.  Patient will follow in clinic with me in 4 to 6 weeks.  If she is no better can discuss colonoscopy.  Ellouise Newer, PA-C [facility] 02/21/2019, 1:11 PM  Cc: McGowen, Adrian Blackwater, MD

## 2019-02-22 NOTE — Progress Notes (Signed)
Addendum: Reviewed and agree with assessment and management plan. Tondra Reierson M, MD  

## 2019-03-12 ENCOUNTER — Encounter: Payer: Self-pay | Admitting: Family Medicine

## 2019-04-19 ENCOUNTER — Telehealth: Payer: Self-pay

## 2019-04-19 NOTE — Telephone Encounter (Signed)
OK to continue taking any meds.

## 2019-04-19 NOTE — Telephone Encounter (Signed)
Patient is getting the COVID Sunday. Is there any medications that the patient shouldn't take the day of her appt?

## 2019-04-19 NOTE — Telephone Encounter (Signed)
Only maintenance med pt takes is omeprazole. Patient still okay to get covid vaccine Sunday?

## 2019-04-21 ENCOUNTER — Ambulatory Visit: Payer: BC Managed Care – PPO | Attending: Internal Medicine

## 2019-04-21 DIAGNOSIS — Z23 Encounter for immunization: Secondary | ICD-10-CM | POA: Insufficient documentation

## 2019-04-21 NOTE — Progress Notes (Signed)
   Covid-19 Vaccination Clinic  Name:  Leslie Beck    MRN: [id number] DOB: 01-16-1986  04/21/2019  [name] was observed post Covid-19 immunization for 15 minutes without incident. She was provided with Vaccine Information Sheet and instruction to access the V-Safe system.   [name] was instructed to call 911 with any severe reactions post vaccine: Marland Kitchen Difficulty breathing  . Swelling of face and throat  . A fast heartbeat  . A bad rash all over body  . Dizziness and weakness   Immunizations Administered    Name Date Dose VIS Date Route   Pfizer COVID-19 Vaccine 04/21/2019  9:26 AM 0.3 mL 01/25/2019 Intramuscular   Manufacturer: [employer], [employer]   Lot: [id number]   NDC: [address]-8

## 2019-04-22 ENCOUNTER — Other Ambulatory Visit: Payer: Self-pay

## 2019-04-22 NOTE — Telephone Encounter (Signed)
Left detailed message. Okay per DPR 

## 2019-05-12 ENCOUNTER — Ambulatory Visit: Payer: Self-pay | Attending: Internal Medicine

## 2019-05-12 DIAGNOSIS — Z23 Encounter for immunization: Secondary | ICD-10-CM

## 2019-05-12 NOTE — Progress Notes (Signed)
   Covid-19 Vaccination Clinic  Name:  Leslie Beck    MRN: [id number] DOB: 1985-03-21  05/12/2019  [name] was observed post Covid-19 immunization for 15 minutes without incident. She was provided with Vaccine Information Sheet and instruction to access the V-Safe system.   [name] was instructed to call 911 with any severe reactions post vaccine: Marland Kitchen Difficulty breathing  . Swelling of face and throat  . A fast heartbeat  . A bad rash all over body  . Dizziness and weakness   Immunizations Administered    Name Date Dose VIS Date Route   Pfizer COVID-19 Vaccine 05/12/2019  9:18 AM 0.3 mL 01/25/2019 Intramuscular   Manufacturer: [employer], [employer]   Lot: [id number]   NDC: [address]-3

## 2019-07-19 IMAGING — US US EXTREM LOW VENOUS*R*
1 series · 13 of 24 positions shown · non-contrast
Comparison: Ultrasound July 03, 2014.

CLINICAL DATA: Right lower extremity pain and swelling.



[Series 1: us extrem low venous*right* · 0.08mm/px · 13 of 27 slices shown]
[im 1/27]
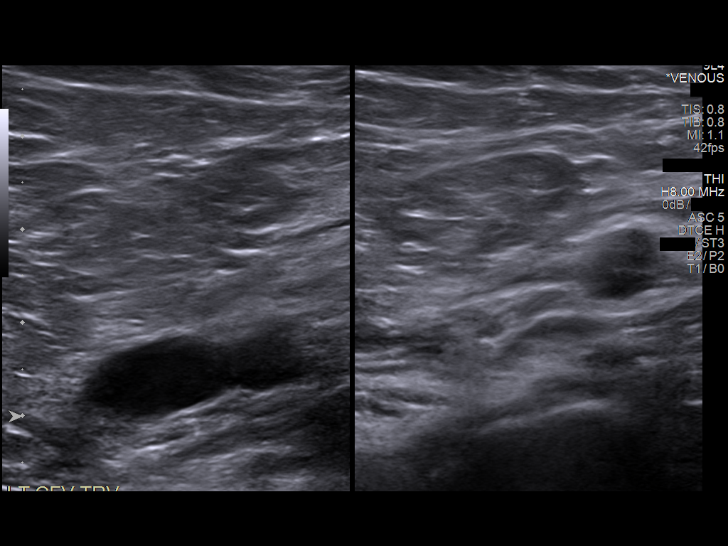
[im 3/27]
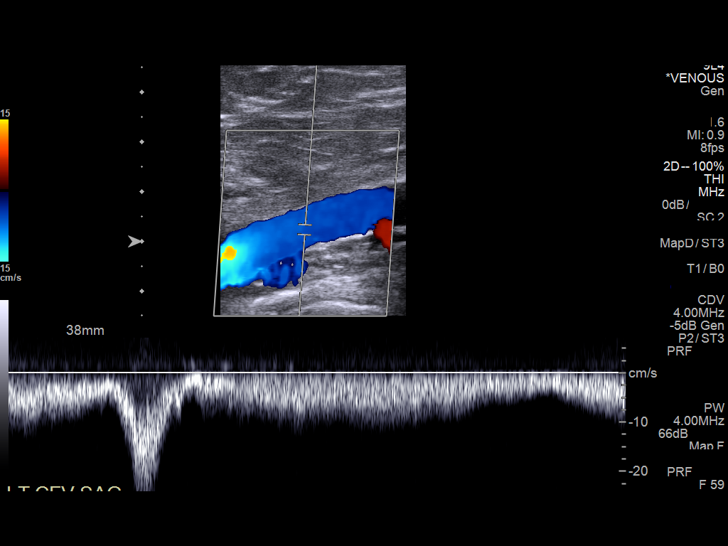
[im 5/27]
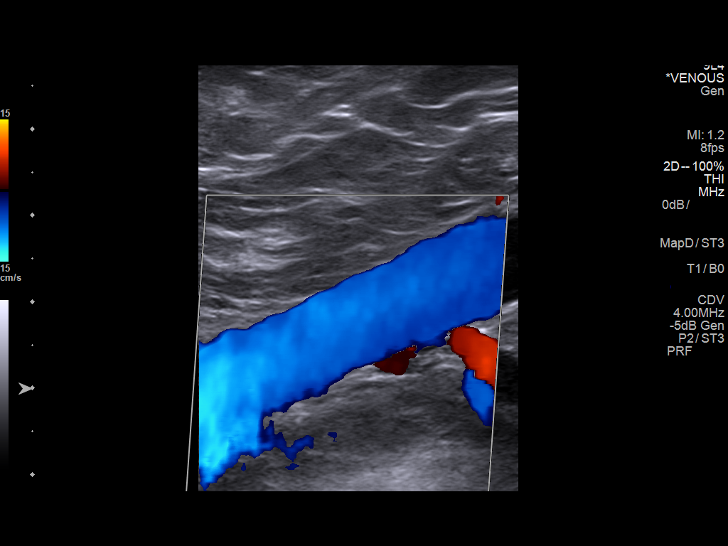
[im 7/27]
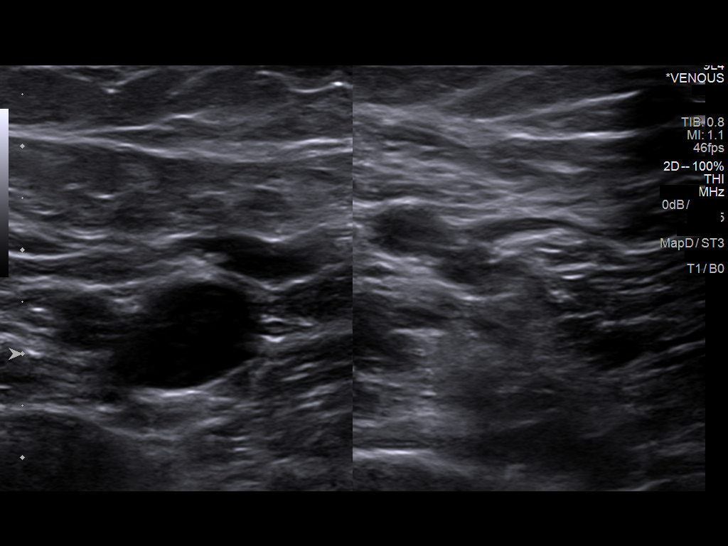
[im 10/27]
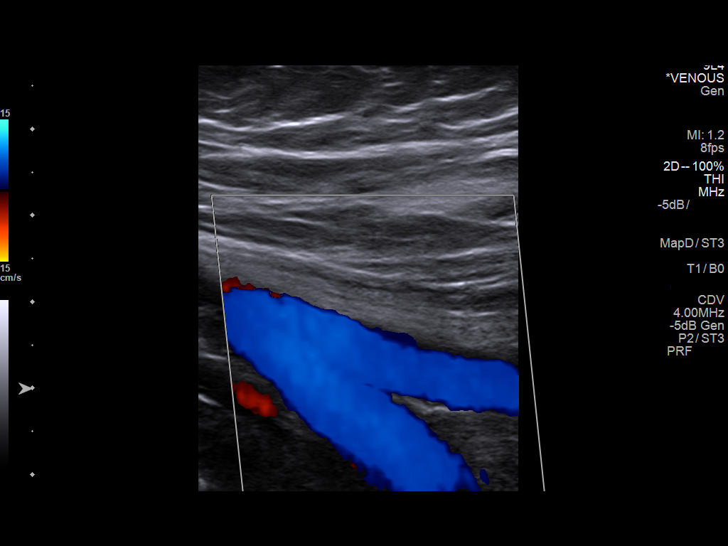
[im 12/27]
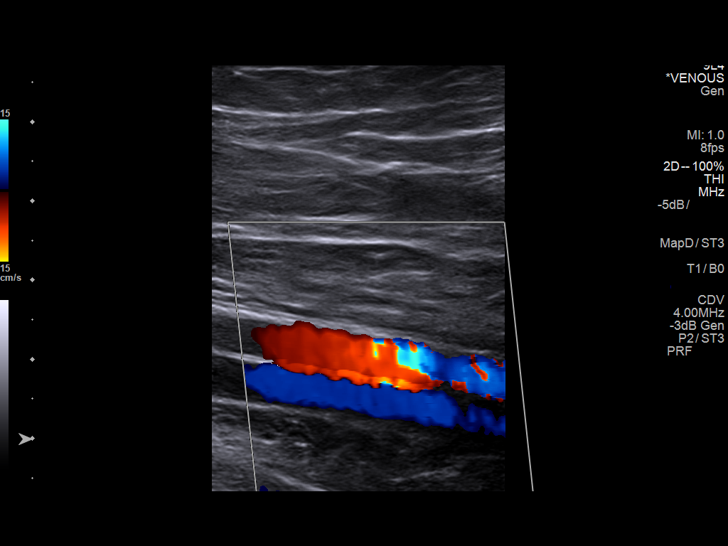
[im 14/27]
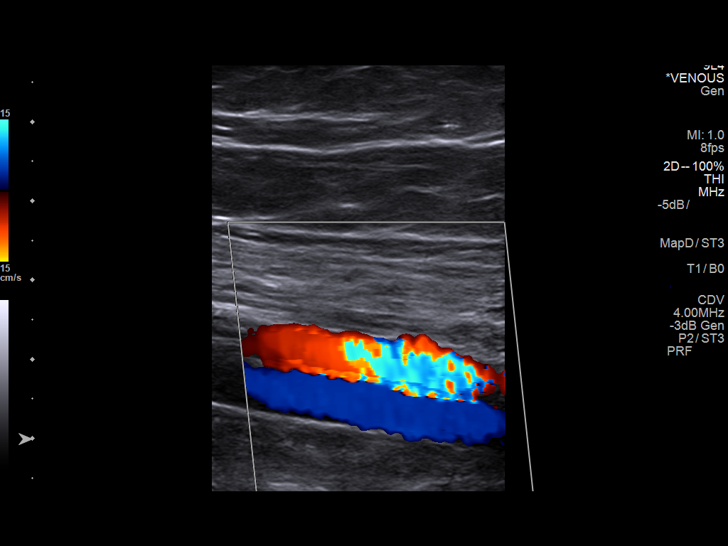
[im 15/27]
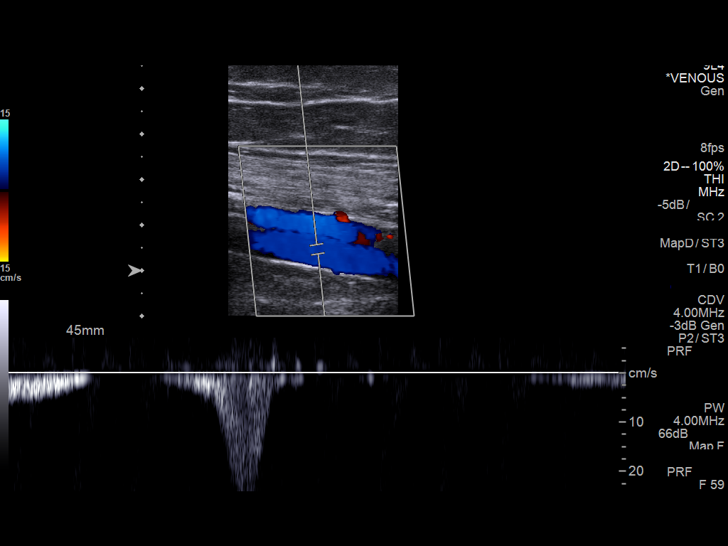
[im 17/27]
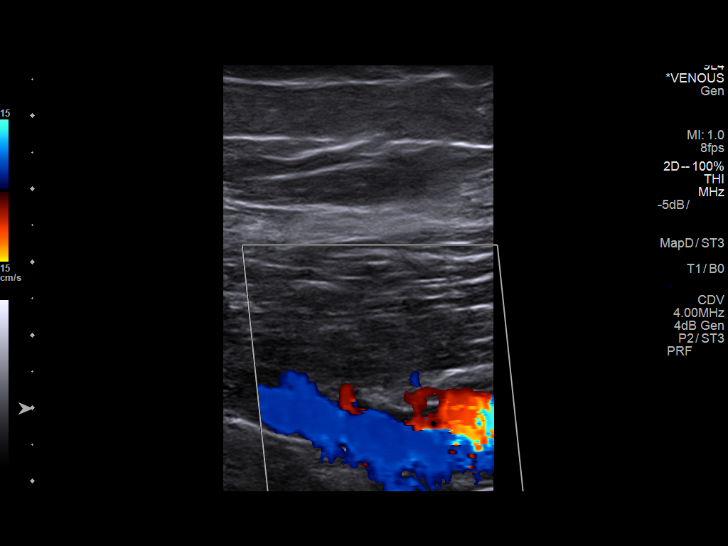
[im 20/27]
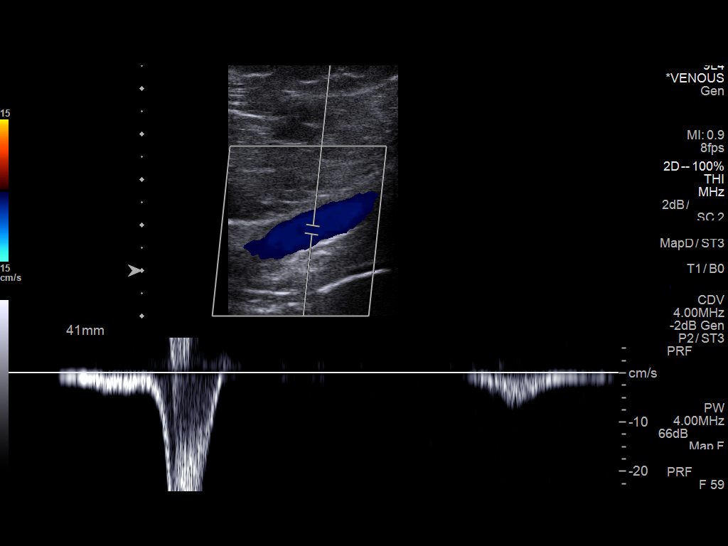
[im 22/27]
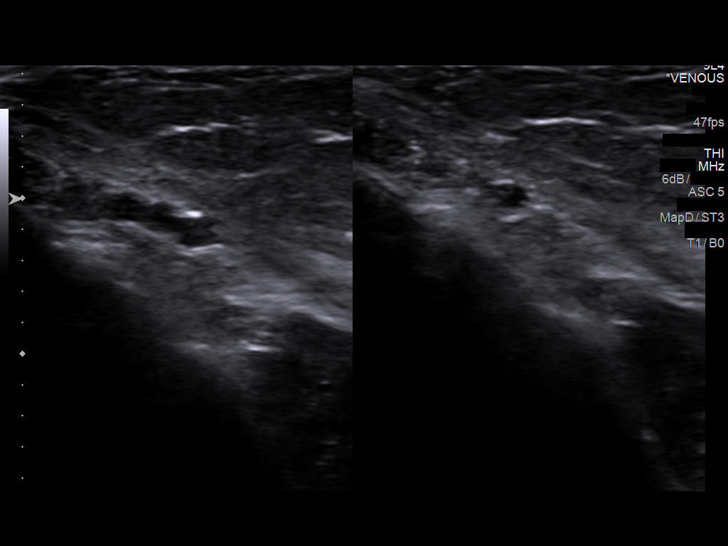
[im 24/27]
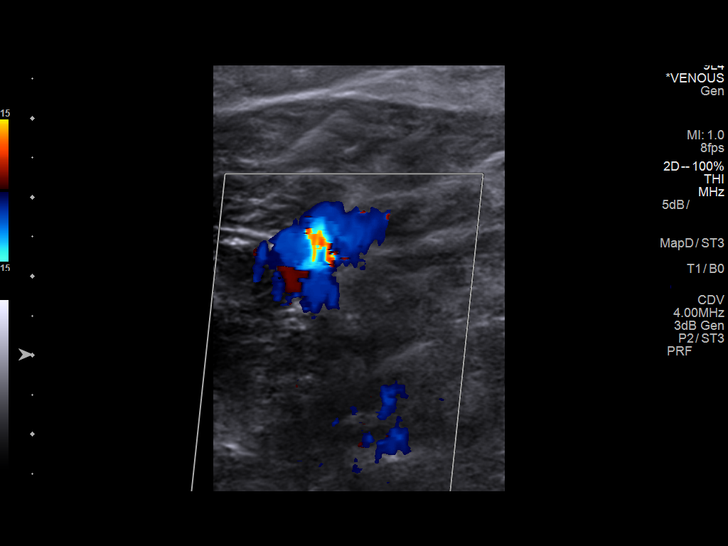
[im 27/27]
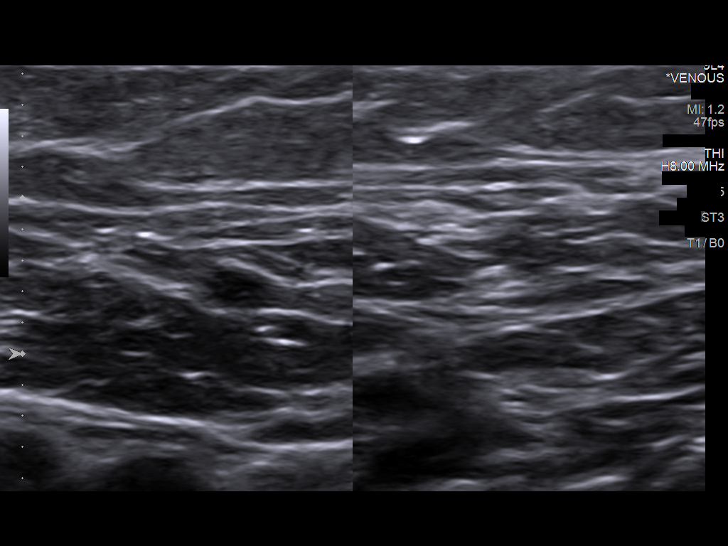

[13 of 24 positions shown; findings below may reference images not displayed]

FINDINGS: Contralateral Common Femoral Vein: Respiratory phasicity is normal
and symmetric with the symptomatic side. No evidence of thrombus.
Normal compressibility.

Common Femoral Vein: No evidence of thrombus. Normal
compressibility, respiratory phasicity and response to augmentation.

Saphenofemoral Junction: No evidence of thrombus. Normal
compressibility and flow on color Doppler imaging.

Profunda Femoral Vein: No evidence of thrombus. Normal
compressibility and flow on color Doppler imaging.

Femoral Vein: No evidence of thrombus. Normal compressibility,
respiratory phasicity and response to augmentation.

Popliteal Vein: No evidence of thrombus. Normal compressibility,
respiratory phasicity and response to augmentation.

Calf Veins: No evidence of thrombus. Normal compressibility and flow
on color Doppler imaging.

Superficial Great Saphenous Vein: No evidence of thrombus. Normal
compressibility and flow on color Doppler imaging.

Venous Reflux:  None.

Other Findings:  None.
IMPRESSION: No evidence of DVT within the right lower extremity.

## 2019-07-29 ENCOUNTER — Other Ambulatory Visit: Payer: Self-pay

## 2019-07-29 ENCOUNTER — Encounter: Payer: Self-pay | Admitting: Family Medicine

## 2019-07-29 ENCOUNTER — Telehealth (INDEPENDENT_AMBULATORY_CARE_PROVIDER_SITE_OTHER): Payer: BC Managed Care – PPO | Admitting: Family Medicine

## 2019-07-29 VITALS — BP 124/87 | Wt 193.0 lb

## 2019-07-29 DIAGNOSIS — K219 Gastro-esophageal reflux disease without esophagitis: Secondary | ICD-10-CM

## 2019-07-29 DIAGNOSIS — K589 Irritable bowel syndrome without diarrhea: Secondary | ICD-10-CM

## 2019-07-29 DIAGNOSIS — R1013 Epigastric pain: Secondary | ICD-10-CM

## 2019-07-29 MED ORDER — FAMOTIDINE 40 MG PO TABS: 40.0000 mg | ORAL_TABLET | Freq: Every day | ORAL | 6 refills | Status: DC

## 2019-07-29 NOTE — Progress Notes (Signed)
Virtual Visit via Video Note  I connected with pt on 07/29/19 at  1:00 PM EDT by a video enabled telemedicine application and verified that I am speaking with the correct person using two identifiers.  Location patient: home Location provider:work or home office Persons participating in the virtual visit: patient, provider  I discussed the limitations of evaluation and management by telemedicine and the availability of in person appointments. The patient expressed understanding and agreed to proceed.  Telemedicine visit is a necessity given the COVID-19 restrictions in place at the current time.  HPI: 34 y/o WF being seen today for "acid reflux". Substernal burning, gurgle up into throat, also some upper abd gas and postprandial urgent BM when her GER sx's are acting up a lot.  No dysphagia or odynophagia.  No n/v.  She went to GI 02/2019, rx'd omep 40mg  qAM + option of 20 omep in PM or H2 blocker in PM.  Also rx 'd probiotic.   She took 1 mo of these meds.and was improving but then noted some constipation.  She was told to add fiber but never had to end up doing this. She chose to stop the meds at that time and see how things went--abruptyl--did not wean off. Her diet was not that bad but had return of substernal burning, indigestion, some lump in throat feeling, got back on omep, got better for a while again and stopped it again abruptly.  She restarted "[facility]" probiotic.  Now back on 40mg  omeprazole again.  No fevers, no blood in stool, no wt loss. She just started her menses, says there is no way she is pregnant.  OF NOTE: Pepcid seemed to commonly help much better than the daily omeprazole. She does eat a GERD friendly diet still, does not elevate head of bed.   Her GERD sx's are worst in morning when first wakes up, feels it "sitting" in back of throat.  This often triggers IBS sx's. Gassy and bloated last few weeks.  ROS: no fevers, no exertional CP, no SOB, no  wheezing, no cough, no dizziness, no HAs, no rashes, no melena/hematochezia.  No polyuria or polydipsia.  No myalgias or arthralgias.  No focal weakness, paresthesias, or tremors.  No acute vision or hearing abnormalities. No n/v.  No palpitations.     Past Medical History:  Diagnosis Date  . ALLERGIC RHINITIS   . Asthma   . Blood type O+ 01/17/2012   Done by OB/GYN  . Family history of colon cancer    2 second degree relatives: needs to start screening colonoscopies at age 4 per GI.  14/04/2011 GERD (gastroesophageal reflux disease)    not on daily PPI anymore  . IBS (irritable bowel syndrome) 2017   alt constip/diarrhea  . Leg pain 06/2016; 10/2016   06/2016 Bilat. R LE doppler--  No DVT.  10/2016, R leg--R LE doppler--NO DVT.  07/2016 Obesity, Class II, BMI 35-39.9   . Psoriasis    vs dyshidrotic dermatitis per latest eval by Dr. 11/2016 08/15/13  . Recurrent UTI 2012/13    Past Surgical History:  Procedure Laterality Date  . NO PAST SURGERIES      Family History  Problem Relation Age of Onset  . Hypertension Mother   . Hypothyroidism Mother   . Other Mother        varicose veins  . Hypertension Father   . Colon cancer Maternal Grandfather   . Colon cancer Paternal Grandmother      Current  Outpatient Medications:  .  acetaminophen (TYLENOL) 500 MG tablet, Take 1,000 mg by mouth every 6 (six) hours as needed for headache., Disp: , Rfl:  .  clobetasol ointment (TEMOVATE) 1.01 %, Apply 1 application topically at bedtime as needed (psoriasis)., Disp: 30 g, Rfl: 1 .  fluticasone (FLONASE) 50 MCG/ACT nasal spray, fluticasone propionate 50 mcg/actuation nasal spray,suspension, Disp: , Rfl:  .  ibuprofen (ADVIL,MOTRIN) 600 MG tablet, Take 1 tablet (600 mg total) by mouth every 6 (six) hours., Disp: 40 tablet, Rfl: 0 .  omeprazole (PRILOSEC OTC) 20 MG tablet, Take 20 mg by mouth daily., Disp: , Rfl:  .  omeprazole (PRILOSEC) 40 MG capsule, Take 1 capsule (40 mg total) by mouth daily., Disp: 30  capsule, Rfl: 2 .  Simethicone (GAS-X PO), Take 1 tablet by mouth daily as needed (digestion)., Disp: , Rfl:   EXAM:  VITALS per patient if applicable: There were no vitals taken for this visit.   GENERAL: alert, oriented, appears well and in no acute distress  HEENT: atraumatic, conjunttiva clear, no obvious abnormalities on inspection of external nose and ears  NECK: normal movements of the head and neck  LUNGS: on inspection no signs of respiratory distress, breathing rate appears normal, no obvious gross SOB, gasping or wheezing  CV: no obvious cyanosis  MS: moves all visible extremities without noticeable abnormality  PSYCH/NEURO: pleasant and cooperative, no obvious depression or anxiety, speech and thought processing grossly intact  LABS: none today    Chemistry      Component Value Date/Time   NA 138 08/17/2016 0552   K 4.4 08/17/2016 0552   CL 107 08/17/2016 0552   CO2 22 08/17/2016 0552   BUN 10 08/17/2016 0552   CREATININE 0.64 08/17/2016 0552      Component Value Date/Time   CALCIUM 9.2 08/17/2016 0552   ALKPHOS 211 (H) 08/17/2016 0552   AST 18 08/17/2016 0552   ALT 14 08/17/2016 0552   BILITOT 0.7 08/17/2016 0552      ASSESSMENT AND PLAN:  Discussed the following assessment and plan:  GERD, dyspepsia, and IBS. Stop omeprazole, fiber supplement, and fiber supplement. Elevate head of bed with a 2 x 4 or brick---no propping up on pillows. Start pepcid 40mg  bid--eRx'd today. I did warn her that if/when she stops taking acid suppressors in the future after having been on them scheduled/regular basis that it is important to wean off of them to avoid potential rebound severe GERD (which could have been contributing in her instance).   I discussed the assessment and treatment plan with the patient. The patient was provided an opportunity to ask questions and all were answered. The patient agreed with the plan and demonstrated an understanding of the  instructions.   The patient was advised to call back or seek an in-person evaluation if the symptoms worsen or if the condition fails to improve as anticipated.  F/u: if not improving.  Signed:  Crissie Sickles, MD           07/29/2019

## 2019-08-29 ENCOUNTER — Ambulatory Visit: Payer: BC Managed Care – PPO | Admitting: Physician Assistant

## 2020-06-24 ENCOUNTER — Encounter: Payer: Self-pay | Admitting: Family Medicine

## 2020-08-02 ENCOUNTER — Other Ambulatory Visit: Payer: Self-pay | Admitting: Family Medicine

## 2020-09-09 ENCOUNTER — Encounter: Payer: Self-pay | Admitting: Family Medicine

## 2020-09-09 ENCOUNTER — Telehealth (INDEPENDENT_AMBULATORY_CARE_PROVIDER_SITE_OTHER): Payer: BC Managed Care – PPO | Admitting: Family Medicine

## 2020-09-09 DIAGNOSIS — E669 Obesity, unspecified: Secondary | ICD-10-CM | POA: Diagnosis not present

## 2020-09-09 DIAGNOSIS — U071 COVID-19: Secondary | ICD-10-CM

## 2020-09-09 DIAGNOSIS — R519 Headache, unspecified: Secondary | ICD-10-CM | POA: Diagnosis not present

## 2020-09-09 MED ORDER — PREDNISONE 20 MG PO TABS: ORAL_TABLET | ORAL | 0 refills | Status: DC

## 2020-09-09 MED ORDER — FAMOTIDINE 40 MG PO TABS: 40.0000 mg | ORAL_TABLET | Freq: Every day | ORAL | 0 refills | Status: DC

## 2020-09-09 MED ORDER — MOLNUPIRAVIR EUA 200MG CAPSULE: 4.0000 | ORAL_CAPSULE | Freq: Two times a day (BID) | ORAL | 0 refills | Status: AC

## 2020-09-09 NOTE — Progress Notes (Signed)
VIRTUAL VISIT VIA VIDEO  I connected with Leslie Beck on 09/09/20 at  8:30 AM EDT by elemedicine application and verified that I am speaking with the correct person using two identifiers. Location patient: Home Location provider: [facility], Office Persons participating in the virtual visit: Patient, Dr. Claiborne Billings and Reece Agar. Cesar, CMA  I discussed the limitations of evaluation and management by telemedicine and the availability of in person appointments. The patient expressed understanding and agreed to proceed.   SUBJECTIVE Chief Complaint  Patient presents with   Covid Positive    Pt c/o fever of 101, sinus pressure, productive coughing with greenish tint x 2 days; tested pos at home 7/26    HPI: Leslie Beck is a 35 y.o. female present for covid illness.  On Monday she noticed a severe headache, then progressed to sinus pressure and congestion. Mild cough present. Fever Tmax 101F.  Exposure:unknown Sx start: 2 days ago Vaccine:x2 vaccine > 6 mos.  Pt denies shortness of breath Otc: advil GFR: > 60 Anticoag: n/a   ROS: See pertinent positives and negatives per HPI.  Patient Active Problem List   Diagnosis Date Noted   Obesity (BMI 30-39.9) 09/09/2020   COVID-19 09/09/2020   Cyst of ovary 01/29/2019   GERD (gastroesophageal reflux disease) 04/01/2015   Psoriasis 07/28/2011    Social History   Tobacco Use   Smoking status: Never   Smokeless tobacco: Never  Substance Use Topics   Alcohol use: No    Current Outpatient Medications:    acetaminophen (TYLENOL) 500 MG tablet, Take 1,000 mg by mouth every 6 (six) hours as needed for headache., Disp: , Rfl:    clobetasol ointment (TEMOVATE) 0.05 %, Apply 1 application topically at bedtime as needed (psoriasis)., Disp: 30 g, Rfl: 1   fluticasone (FLONASE) 50 MCG/ACT nasal spray, fluticasone propionate 50 mcg/actuation nasal spray,suspension, Disp: , Rfl:    ibuprofen (ADVIL,MOTRIN) 600 MG tablet, Take 1 tablet  (600 mg total) by mouth every 6 (six) hours., Disp: 40 tablet, Rfl: 0   molnupiravir EUA 200 mg CAPS, Take 4 capsules (800 mg total) by mouth 2 (two) times daily for 5 days., Disp: 40 capsule, Rfl: 0   predniSONE (DELTASONE) 20 MG tablet, 60 mg x3d, 40 mg x3d, 20 mg x2d, 10 mg x2d, Disp: 18 tablet, Rfl: 0   Probiotic Product (PROBIOTIC PO), Take by mouth daily., Disp: , Rfl:    Simethicone (GAS-X PO), Take 1 tablet by mouth daily as needed (digestion)., Disp: , Rfl:    tacrolimus (PROTOPIC) 0.1 % ointment, Apply topically 2 (two) times daily., Disp: , Rfl:    Vitamin D, Ergocalciferol, (DRISDOL) 1.25 MG (50000 UNIT) CAPS capsule, Take 50,000 Units by mouth once a week., Disp: , Rfl:    famotidine (PEPCID) 40 MG tablet, Take 1 tablet (40 mg total) by mouth daily., Disp: 90 tablet, Rfl: 0  Allergies  Allergen Reactions   Latex Rash   Zithromax [Azithromycin] Rash    OBJECTIVE: There were no vitals taken for this visit. Gen: No acute distress. Nontoxic in appearance. Pleasant female, sounds congested in sinus/nose HENT: AT. [place].  MMM.  Eyes:Pupils Equal Round Reactive to light, Extraocular movements intact,  Conjunctiva without redness, discharge or icterus. Chest: Cough or shortness of breath not present on exam Skin: no rashes, purpura or petechiae.  Neuro: Alert. Oriented x3  Psych: Normal affect and demeanor. Normal speech. Normal thought content and judgment.  ASSESSMENT AND PLAN: Leslie Beck is a 35 y.o. female  present for  COVID-19/sinus headache and congestion/obesity RF Rest, hydrate.  +/- OTC flonase, mucinex (DM if cough), nettie pot or nasal saline.  Prednisone and molnupiravir prescribed, take until completed.  Reviewed home care instructions for COVID. Advised self-isolation at home for at least 5 days. After 5 days, if improved and fever resolved, can be in public, but should wear a mask around others for an additional 5 days. If symptoms, esp, dyspnea develops/worsens,  recommend in-person evaluation at either an urgent care or the emergency room.    Felix Pacini, DO 09/09/2020   Return if symptoms worsen or fail to improve.  No orders of the defined types were placed in this encounter.  Meds ordered this encounter  Medications   predniSONE (DELTASONE) 20 MG tablet    Sig: 60 mg x3d, 40 mg x3d, 20 mg x2d, 10 mg x2d    Dispense:  18 tablet    Refill:  0   molnupiravir EUA 200 mg CAPS    Sig: Take 4 capsules (800 mg total) by mouth 2 (two) times daily for 5 days.    Dispense:  40 capsule    Refill:  0   famotidine (PEPCID) 40 MG tablet    Sig: Take 1 tablet (40 mg total) by mouth daily.    Dispense:  90 tablet    Refill:  0    MUST HAVE OV FOR FURTHER REFILLS    Referral Orders  No referral(s) requested today

## 2020-09-09 NOTE — Patient Instructions (Signed)
10 Things You Can Do to Manage Your COVID-19 Symptoms at Home If you have possible or confirmed COVID-19 Stay home except to get medical care. Monitor your symptoms carefully. If your symptoms get worse, call your healthcare provider immediately. Get rest and stay hydrated. If you have a medical appointment, call the healthcare provider ahead of time and tell them that you have or may have COVID-19. For medical emergencies, call 911 and notify the dispatch personnel that you have or may have COVID-19. Cover your cough and sneezes with a tissue or use the inside of your elbow. Wash your hands often with soap and water for at least 20 seconds or clean your hands with an alcohol-based hand sanitizer that contains at least 60% alcohol. As much as possible, stay in a specific room and away from other people in your home. Also, you should use a separate bathroom, if available. If you need to be around other people in or outside of the home, wear a mask. Avoid sharing personal items with other people in your household, like dishes, towels, and bedding. Clean all surfaces that are touched often, like counters, tabletops, and doorknobs. Use household cleaning sprays or wipes according to the label instructions. cdc.gov/coronavirus 08/30/2019 This information is not intended to replace advice given to you by your health care provider. Make sure you discuss any questions you have with your healthcare provider. Document Revised: 03/20/2020 Document Reviewed: 03/20/2020 Elsevier Patient Education  2022 Elsevier Inc.  

## 2021-06-08 ENCOUNTER — Ambulatory Visit: Payer: BC Managed Care – PPO | Admitting: Family Medicine

## 2021-06-08 ENCOUNTER — Encounter: Payer: Self-pay | Admitting: Family Medicine

## 2021-06-08 VITALS — BP 125/87 | HR 117 | Temp 98.3°F | Ht 61.25 in | Wt 199.8 lb

## 2021-06-08 DIAGNOSIS — I10 Essential (primary) hypertension: Secondary | ICD-10-CM

## 2021-06-08 DIAGNOSIS — R790 Abnormal level of blood mineral: Secondary | ICD-10-CM | POA: Diagnosis not present

## 2021-06-08 DIAGNOSIS — E559 Vitamin D deficiency, unspecified: Secondary | ICD-10-CM

## 2021-06-08 LAB — COMPREHENSIVE METABOLIC PANEL
ALT: 15 U/L (ref 0–35)
AST: 15 U/L (ref 0–37)
Albumin: 4.4 g/dL (ref 3.5–5.2)
Alkaline Phosphatase: 76 U/L (ref 39–117)
BUN: 12 mg/dL (ref 6–23)
CO2: 25 mEq/L (ref 19–32)
Calcium: 9.1 mg/dL (ref 8.4–10.5)
Chloride: 103 mEq/L (ref 96–112)
Creatinine, Ser: 0.73 mg/dL (ref 0.40–1.20)
GFR: 106.57 mL/min (ref >60.00)
Glucose, Bld: 84 mg/dL (ref 70–99)
Potassium: 4.1 mEq/L (ref 3.5–5.1)
Sodium: 140 mEq/L (ref 135–145)
Total Bilirubin: 1.3 mg/dL — ABNORMAL HIGH (ref 0.2–1.2)
Total Protein: 7 g/dL (ref 6.0–8.3)

## 2021-06-08 LAB — CBC
HCT: 42.4 % (ref 36.0–46.0)
Hemoglobin: 14.3 g/dL (ref 12.0–15.0)
MCHC: 33.7 g/dL (ref 30.0–36.0)
MCV: 91 fl (ref 78.0–100.0)
Platelets: 272 10*3/uL (ref 150.0–400.0)
RBC: 4.66 Mil/uL (ref 3.87–5.11)
RDW: 12.5 % (ref 11.5–15.5)
WBC: 10.6 10*3/uL — ABNORMAL HIGH (ref 4.0–10.5)

## 2021-06-08 LAB — TSH: TSH: 1.73 u[IU]/mL (ref 0.35–5.50)

## 2021-06-08 LAB — MAGNESIUM: Magnesium: 2.1 mg/dL (ref 1.5–2.5)

## 2021-06-08 LAB — VITAMIN D 25 HYDROXY (VIT D DEFICIENCY, FRACTURES): VITD: 30.78 ng/mL (ref 30.00–100.00)

## 2021-06-08 MED ORDER — BISOPROLOL FUMARATE 5 MG PO TABS: 5.0000 mg | ORAL_TABLET | Freq: Every day | ORAL | 1 refills | Status: DC

## 2021-06-08 NOTE — Progress Notes (Signed)
OFFICE VISIT ? ?06/08/2021 ? ?CC:  ?Chief Complaint  ?Patient presents with  ? Elevated blood pressure  ?  Went to GYN last week, BP was high 140/90. Bought BP machine and has still been consistently high. Average systolic ranging from 761-950, diastolic 93-26   ? ?Patient is a 36 y.o. female who presents for elevated blood pressure. ? ?HPI: ?Leslie Beck went to her GYN office about a week ago for some UTI/vaginitis symptoms.  Blood pressure there was 140/90.  She got a cuff for home and checks there have been 712-458 systolic over 09X to 83J diastolic. ? ?Home bP on 4/24 (yesterday) 125-131 syst over 93-100 diast. ?She feels well other than having some upset stomach last week when on Diflucan. ?She takes no NSAIDs with any regularity.  No decongestants. ? 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 ?Mother and father both with hypertension. ? ?History of vitamin D deficiency, received replacement therapy for 4 months, has been off this medication for 6 months. ?History of borderline low magnesium, not currently on any supplement. ? ?ROS as above, plus--> no fevers, no CP, no SOB, no wheezing, no cough, no dizziness, no HAs, no rashes, no melena/hematochezia.  No polyuria or polydipsia.  No myalgias or arthralgias.  No focal weakness, paresthesias, or tremors.  No acute vision or hearing abnormalities.  No dysuria or unusual/new urinary urgency or frequency.  No recent changes in lower legs. ?No n/v/d or abd pain.  No palpitations.   ? ?Past Medical History:  ?Diagnosis Date  ? ALLERGIC RHINITIS   ? Asthma   ? Blood type O+ 01/17/2012  ? Done by OB/GYN  ? Family history of colon cancer   ? 2 second degree relatives: needs to start screening colonoscopies at age 24 per GI.  ? GERD (gastroesophageal reflux disease)   ? not on daily PPI anymore  ? IBS (irritable bowel syndrome) 2017  ? alt constip/diarrhea  ? Leg pain 06/2016; 10/2016  ? 06/2016 Bilat. R LE doppler--  No DVT.   10/2016, R leg--R LE doppler--NO DVT.  ? Obesity, Class II, BMI 35-39.9   ? Psoriasis   ? vs dyshidrotic dermatitis per latest eval by Dr. Allyson Sabal 08/15/13  ? Recurrent UTI 2012/13  ? ? ?Past Surgical History:  ?Procedure Laterality Date  ? NO PAST SURGERIES    ? ?Family History  ?Problem Relation Age of Onset  ? Hypertension Mother   ? Hypothyroidism Mother   ? Other Mother   ?     varicose veins  ? Hypertension Father   ? Colon cancer Maternal Grandfather   ? Colon cancer Paternal Grandmother   ? ?Social History  ? ?Socioeconomic History  ? Marital status: Married  ?  Spouse name: Not on file  ? Number of children: 2  ? Years of education: Not on file  ? Highest education level: Not on file  ?Occupational History  ? Not on file  ?Tobacco Use  ? Smoking status: Never  ? Smokeless tobacco: Never  ?Vaping Use  ? Vaping Use: Never used  ?Substance and Sexual Activity  ? Alcohol use: No  ? Drug use: No  ? Sexual activity: Yes  ?  Partners: Male  ?  Birth control/protection: None  ?Other Topics Concern  ? Not on file  ?Social History Narrative  ? Married, two children.  ? Orig from [place], went to [employer].  ? 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  ?  No T/A/Ds.  ? Sees Dr. Harrington Challenger for GYN.  ? ?Social Determinants of Health  ? ?Financial Resource Strain: Not on file  ?Food Insecurity: Not on file  ?Transportation Needs: Not on file  ?Physical Activity: Not on file  ?Stress: Not on file  ?Social Connections: Not on file  ? ? ?Outpatient Medications Prior to Visit  ?Medication Sig Dispense Refill  ? acetaminophen (TYLENOL) 500 MG tablet Take 1,000 mg by mouth every 6 (six) hours as needed for headache.    ? clobetasol ointment (TEMOVATE) 0.62 % Apply 1 application topically at bedtime as needed (psoriasis). 30 g 1  ? famotidine (PEPCID) 40 MG tablet Take 1 tablet (40 mg total) by mouth daily. 90 tablet 0  ? fluconazole (DIFLUCAN) 150 MG tablet Take by mouth.    ? fluticasone (FLONASE) 50 MCG/ACT nasal spray fluticasone  propionate 50 mcg/actuation nasal spray,suspension    ? Probiotic Product (PROBIOTIC PO) Take by mouth daily. (Patient not taking: Reported on 06/08/2021)    ? Simethicone (GAS-X PO) Take 1 tablet by mouth daily as needed (digestion). (Patient not taking: Reported on 06/08/2021)    ? ibuprofen (ADVIL,MOTRIN) 600 MG tablet Take 1 tablet (600 mg total) by mouth every 6 (six) hours. (Patient not taking: Reported on 06/08/2021) 40 tablet 0  ? predniSONE (DELTASONE) 20 MG tablet 60 mg x3d, 40 mg x3d, 20 mg x2d, 10 mg x2d (Patient not taking: Reported on 06/08/2021) 18 tablet 0  ? tacrolimus (PROTOPIC) 0.1 % ointment Apply topically 2 (two) times daily. (Patient not taking: Reported on 06/08/2021)    ? Vitamin D, Ergocalciferol, (DRISDOL) 1.25 MG (50000 UNIT) CAPS capsule Take 50,000 Units by mouth once a week. (Patient not taking: Reported on 06/08/2021)    ? ?No facility-administered medications prior to visit.  ? ? ?Allergies  ?Allergen Reactions  ? Latex Rash  ? Zithromax [Azithromycin] Rash  ? ? ?ROS ?As per HPI ? ?PE: ? ?  06/08/2021  ?  9:39 AM 07/29/2019  ?  1:01 PM 02/21/2019  ?  1:11 PM  ?Vitals with BMI  ?Height 5' 1.25"  5' 1.25"  ?Weight 199 lbs 13 oz 193 lbs 203 lbs 6 oz  ?BMI 37.43  38.1  ?Systolic 376 283 151  ?Diastolic 87 87 80  ?Pulse 117  120  ? ?Physical Exam ? ?Exam chaperoned by Deveron Furlong, CMA. ?Gen: Alert, well appearing.  Patient is oriented to person, place, time, and situation. ?AFFECT: pleasant, lucid thought and speech. ?VOH:YWVP: no injection, icteris, swelling, or exudate.  EOMI, PERRLA. ?Mouth: lips without lesion/swelling.  Oral mucosa pink and moist. Oropharynx without erythema, exudate, or swelling.  ?CV: RRR, no m/r/g.   ?LUNGS: CTA bilat, nonlabored resps, good aeration in all lung fields. ?ABD: soft, NT, ND, BS normal.  No hepatospenomegaly or mass.  No bruits. ?EXT: no clubbing or cyanosis.  no edema.  ? ? ?LABS:  ?Last CBC ?Lab Results  ?Component Value Date  ? WBC 12.4 (H) 08/18/2016   ? HGB 11.7 (L) 08/18/2016  ? HCT 34.6 (L) 08/18/2016  ? MCV 92.5 08/18/2016  ? MCH 31.3 08/18/2016  ? RDW 13.8 08/18/2016  ? PLT 151 08/18/2016  ? ?Last metabolic panel ?Lab Results  ?Component Value Date  ? GLUCOSE 101 (H) 08/17/2016  ? NA 138 08/17/2016  ? K 4.4 08/17/2016  ? CL 107 08/17/2016  ? CO2 22 08/17/2016  ? BUN 10 08/17/2016  ? CREATININE 0.64 08/17/2016  ? GFRNONAA >60 08/17/2016  ? CALCIUM  9.2 08/17/2016  ? PROT 6.7 08/17/2016  ? ALBUMIN 3.0 (L) 08/17/2016  ? BILITOT 0.7 08/17/2016  ? ALKPHOS 211 (H) 08/17/2016  ? AST 18 08/17/2016  ? ALT 14 08/17/2016  ? ANIONGAP 9 08/17/2016  ? ?Last lipids ?Lab Results  ?Component Value Date  ? CHOL 189 07/28/2011  ? HDL 75.20 07/28/2011  ? [place] 94 07/28/2011  ? TRIG 97.0 07/28/2011  ? CHOLHDL 3 07/28/2011  ? ?IMPRESSION AND PLAN: ? ?#1 new diagnosis hypertension.  Suspect this is essential hypertension. ?She is asymptomatic.  I do not think we need home BP measurements at this time. ?She tends to run heart rate in the 80-1 10 range chronically. ?I do not think a work-up for secondary hypertension is indicated at this time. ?We will get baseline labs -->CBC, c-Met, and TSH today. ?Start bisoprolol 5 mg a day. ? ?2.  Vitamin D deficiency. ?Recheck vitamin D level today. ? ?#3 history of borderline low magnesium.  Recheck level today. ? ?An After Visit Summary was printed and given to the patient. ? ?FOLLOW UP: Return in about 2 weeks (around 06/22/2021) for f/u HTN. ? ?Signed:  Crissie Sickles, MD           06/08/2021 ? ?

## 2021-06-08 NOTE — Patient Instructions (Signed)

## 2021-07-09 LAB — RESULTS CONSOLE HPV: CHL HPV: NEGATIVE

## 2021-07-09 LAB — HM PAP SMEAR

## 2021-08-23 ENCOUNTER — Other Ambulatory Visit: Payer: Self-pay

## 2021-08-23 MED ORDER — BISOPROLOL FUMARATE 5 MG PO TABS: 5.0000 mg | ORAL_TABLET | Freq: Every day | ORAL | 1 refills | Status: DC

## 2021-08-25 ENCOUNTER — Ambulatory Visit: Payer: BC Managed Care – PPO | Admitting: Family Medicine

## 2021-08-25 ENCOUNTER — Encounter: Payer: Self-pay | Admitting: Family Medicine

## 2021-08-25 VITALS — BP 127/80 | HR 78 | Temp 98.2°F | Ht 61.25 in | Wt 203.4 lb

## 2021-08-25 DIAGNOSIS — E559 Vitamin D deficiency, unspecified: Secondary | ICD-10-CM

## 2021-08-25 DIAGNOSIS — I10 Essential (primary) hypertension: Secondary | ICD-10-CM | POA: Diagnosis not present

## 2021-08-25 MED ORDER — BISOPROLOL FUMARATE 5 MG PO TABS: 5.0000 mg | ORAL_TABLET | Freq: Every day | ORAL | 1 refills | Status: DC

## 2021-08-25 NOTE — Progress Notes (Signed)
OFFICE VISIT  08/25/2021  CC:  Chief Complaint  Patient presents with   Hypertension    Follow up; pt is    Patient is a 36 y.o. female who presents for 26-monthfollow-up hypertension. A/P as of last visit: "#1 new diagnosis hypertension.  Suspect this is essential hypertension. She is asymptomatic.  I do not think we need home BP measurements at this time. She tends to run heart rate in the 80-1 10 range chronically. I do not think a work-up for secondary hypertension is indicated at this time. We will get baseline labs -->CBC, c-Met, and TSH today. Start bisoprolol 5 mg a day.  2.  Vitamin D deficiency. Recheck vitamin D level today.   #3 history of borderline low magnesium.  Recheck level today."  INTERIM HX: Leslie Beck is feeling well. She does note that her energy level is better and she feels less sensation of her heart beating fast, particularly when exercising. She is pleased with the bisoprolol. Her blood pressures at other MD offices and at work have been consistently normal.  All labs were normal last visit.   Past Medical History:  Diagnosis Date   ALLERGIC RHINITIS    Asthma    Family history of colon cancer    2 second degree relatives: needs to start screening colonoscopies at age [age]per GI.   GERD (gastroesophageal reflux disease)    not on daily PPI anymore   Hypertension    IBS (irritable bowel syndrome) 2017   alt constip/diarrhea   Obesity, Class II, BMI 35-39.9    Psoriasis    vs dyshidrotic dermatitis per latest eval by Dr. LAllyson Sabal7/2/15   Recurrent UTI 2012/13    Past Surgical History:  Procedure Laterality Date   NO PAST SURGERIES      Outpatient Medications Prior to Visit  Medication Sig Dispense Refill   acetaminophen (TYLENOL) 500 MG tablet Take 1,000 mg by mouth every 6 (six) hours as needed for headache.     clobetasol ointment (TEMOVATE) 08.11% Apply 1 application topically at bedtime as needed (psoriasis). 30 g 1   famotidine  (PEPCID) 40 MG tablet Take 1 tablet (40 mg total) by mouth daily. 90 tablet 0   fluticasone (FLONASE) 50 MCG/ACT nasal spray fluticasone propionate 50 mcg/actuation nasal spray,suspension     Probiotic Product (PROBIOTIC PO) Take by mouth daily.     Simethicone (GAS-X PO) Take 1 tablet by mouth daily as needed (digestion).     bisoprolol (ZEBETA) 5 MG tablet Take 1 tablet (5 mg total) by mouth daily. 30 tablet 1   fluconazole (DIFLUCAN) 150 MG tablet Take by mouth. (Patient not taking: Reported on 08/25/2021)     No facility-administered medications prior to visit.    Allergies  Allergen Reactions   Latex Rash   Zithromax [Azithromycin] Rash    ROS As per HPI  PE:    08/25/2021    3:22 PM 06/08/2021    9:39 AM 07/29/2019    1:01 PM  Vitals with BMI  Height 5' 1.25" 5' 1.25"   Weight 203 lbs 6 oz 199 lbs 13 oz 193 lbs  BMI 303.15394.58  Systolic [id number] Diastolic 80 87 87  Pulse 78 117      Physical Exam  Gen: Alert, well appearing.  Patient is oriented to person, place, time, and situation. AFFECT: pleasant, lucid thought and speech. No further exam today.  LABS:  Last CBC Lab Results  Component Value Date  WBC 10.6 (H) 06/08/2021   HGB 14.3 06/08/2021   HCT 42.4 06/08/2021   MCV 91.0 06/08/2021   MCH 31.3 08/18/2016   RDW 12.5 06/08/2021   PLT 272.0 91/63/8466   Last metabolic panel Lab Results  Component Value Date   GLUCOSE 84 06/08/2021   NA 140 06/08/2021   K 4.1 06/08/2021   CL 103 06/08/2021   CO2 25 06/08/2021   BUN 12 06/08/2021   CREATININE 0.73 06/08/2021   GFRNONAA >60 08/17/2016   CALCIUM 9.1 06/08/2021   PROT 7.0 06/08/2021   ALBUMIN 4.4 06/08/2021   BILITOT 1.3 (H) 06/08/2021   ALKPHOS 76 06/08/2021   AST 15 06/08/2021   ALT 15 06/08/2021   ANIONGAP 9 08/17/2016   Last lipids Lab Results  Component Value Date   CHOL 189 07/28/2011   HDL 75.20 07/28/2011   LDLCALC 94 07/28/2011   TRIG 97.0 07/28/2011   CHOLHDL 3  07/28/2011   Last thyroid functions Lab Results  Component Value Date   TSH 1.73 06/08/2021   Last vitamin D Lab Results  Component Value Date   VD25OH 30.78 06/08/2021   IMPRESSION AND PLAN:  1) Hypertension, well controlled on bisoprolol 5 mg a day.  Refilled x90 days, 1 additional refill.  #2 vitamin D deficiency. She is status post high-dose replacement by her GYN doctor. Our last check after this was 3 months ago and it was 31. This was improved but she stopped taking any supplement at that time. I recommended she get back on 1000 unit over-the-counter vitamin D supplement daily.  An After Visit Summary was printed and given to the patient.  FOLLOW UP: Return in about 6 months (around 02/25/2022) for routine chronic illness f/u.  Signed:  Crissie Sickles, MD           08/25/2021

## 2022-05-02 ENCOUNTER — Other Ambulatory Visit: Payer: Self-pay | Admitting: Family Medicine

## 2022-05-05 ENCOUNTER — Other Ambulatory Visit: Payer: Self-pay | Admitting: Family Medicine

## 2022-05-06 ENCOUNTER — Telehealth: Payer: Self-pay | Admitting: Family Medicine

## 2022-05-06 DIAGNOSIS — I10 Essential (primary) hypertension: Secondary | ICD-10-CM

## 2022-05-06 MED ORDER — BISOPROLOL FUMARATE 5 MG PO TABS: 5.0000 mg | ORAL_TABLET | Freq: Every day | ORAL | 0 refills | Status: DC

## 2022-05-06 NOTE — Telephone Encounter (Signed)
Partial refill sent to pharmacy

## 2022-05-06 NOTE — Telephone Encounter (Signed)
Patient is scheduled for 05/20/22. However, she will run out of her blood pressure medication bisoprolol (ZEBETA) 5 MG table  on 05/12/22. She is requesting a small amount be called in until her appointment on April 5th. Pharmacy is correct as [facility] in [place]

## 2022-05-20 ENCOUNTER — Encounter: Payer: Self-pay | Admitting: Family Medicine

## 2022-05-20 ENCOUNTER — Ambulatory Visit: Payer: BC Managed Care – PPO | Admitting: Family Medicine

## 2022-05-20 VITALS — BP 122/81 | HR 76 | Wt 210.4 lb

## 2022-05-20 DIAGNOSIS — I10 Essential (primary) hypertension: Secondary | ICD-10-CM | POA: Diagnosis not present

## 2022-05-20 DIAGNOSIS — E559 Vitamin D deficiency, unspecified: Secondary | ICD-10-CM | POA: Diagnosis not present

## 2022-05-20 LAB — BASIC METABOLIC PANEL
BUN: 11 mg/dL (ref 6–23)
CO2: 29 mEq/L (ref 19–32)
Calcium: 9.2 mg/dL (ref 8.4–10.5)
Chloride: 103 mEq/L (ref 96–112)
Creatinine, Ser: 0.8 mg/dL (ref 0.40–1.20)
GFR: 94.85 mL/min (ref >60.00)
Glucose, Bld: 85 mg/dL (ref 70–99)
Potassium: 3.9 mEq/L (ref 3.5–5.1)
Sodium: 141 mEq/L (ref 135–145)

## 2022-05-20 LAB — VITAMIN D 25 HYDROXY (VIT D DEFICIENCY, FRACTURES): VITD: 20.86 ng/mL — ABNORMAL LOW (ref 30.00–100.00)

## 2022-05-20 MED ORDER — BISOPROLOL FUMARATE 5 MG PO TABS: 5.0000 mg | ORAL_TABLET | Freq: Every day | ORAL | 1 refills | Status: DC

## 2022-05-20 NOTE — Progress Notes (Signed)
OFFICE VISIT  05/20/2022  CC:  Chief Complaint  Patient presents with   Medication Refill    Refill on meds. She wants to discuss a cranberry supplement that she has been taking.    Patient is a 37 y.o. female who presents for follow-up hypertension. A/P as of last visit: "1) Hypertension, well controlled on bisoprolol 5 mg a day.  Refilled x90 days, 1 additional refill.   #2 vitamin D deficiency. She is status post high-dose replacement by her GYN doctor. Our last check after this was 3 months ago and it was 31. This was improved but she stopped taking any supplement at that time. I recommended she get back on 1000 unit over-the-counter vitamin D supplement daily."  INTERIM HX: Doing well other than struggling with her weight. Home blood pressures have been consistently less than 130/80.  She has been having recurrent UTIs that have been hard to treat, followed by her GYN MD for these. She is going to start cranberry Azo soon.   Past Medical History:  Diagnosis Date   ALLERGIC RHINITIS    Asthma    Family history of colon cancer    2 second degree relatives: needs to start screening colonoscopies at age 36 per GI.   GERD (gastroesophageal reflux disease)    not on daily PPI anymore   Hypertension    IBS (irritable bowel syndrome) 2017   alt constip/diarrhea   Obesity, Class II, BMI 35-39.9    Psoriasis    vs dyshidrotic dermatitis per latest eval by Dr. Terri Piedra 08/15/13   Recurrent UTI 2012/13    Past Surgical History:  Procedure Laterality Date   NO PAST SURGERIES      Outpatient Medications Prior to Visit  Medication Sig Dispense Refill   acetaminophen (TYLENOL) 500 MG tablet Take 1,000 mg by mouth every 6 (six) hours as needed for headache.     clobetasol ointment (TEMOVATE) 0.05 % Apply 1 application topically at bedtime as needed (psoriasis). 30 g 1   famotidine (PEPCID) 40 MG tablet Take 1 tablet (40 mg total) by mouth daily. 90 tablet 0   fluticasone  (FLONASE) 50 MCG/ACT nasal spray fluticasone propionate 50 mcg/actuation nasal spray,suspension     omeprazole (PRILOSEC OTC) 20 MG tablet Take 20 mg by mouth daily.     Probiotic Product (PROBIOTIC PO) Take by mouth daily.     Simethicone (GAS-X PO) Take 1 tablet by mouth daily as needed (digestion).     fluconazole (DIFLUCAN) 150 MG tablet Take by mouth. (Patient not taking: Reported on 08/25/2021)     bisoprolol (ZEBETA) 5 MG tablet Take 1 tablet (5 mg total) by mouth daily. 15 tablet 0   No facility-administered medications prior to visit.    Allergies  Allergen Reactions   Latex Rash   Zithromax [Azithromycin] Rash    Review of Systems As per HPI  PE:    05/20/2022    1:20 PM 08/25/2021    3:22 PM 06/08/2021    9:39 AM  Vitals with BMI  Height  5' 1.25" 5' 1.25"  Weight 210 lbs 6 oz 203 lbs 6 oz 199 lbs 13 oz  BMI  38.11 37.43  Systolic 122 127 035  Diastolic 81 80 87  Pulse 76 78 117     Physical Exam  Gen: Alert, well appearing.  Patient is oriented to person, place, time, and situation. AFFECT: pleasant, lucid thought and speech. No further exam today  LABS:  Last metabolic panel Lab Results  Component Value Date   GLUCOSE 84 06/08/2021   NA 140 06/08/2021   K 4.1 06/08/2021   CL 103 06/08/2021   CO2 25 06/08/2021   BUN 12 06/08/2021   CREATININE 0.73 06/08/2021   GFRNONAA >60 08/17/2016   CALCIUM 9.1 06/08/2021   PROT 7.0 06/08/2021   ALBUMIN 4.4 06/08/2021   BILITOT 1.3 (H) 06/08/2021   ALKPHOS 76 06/08/2021   AST 15 06/08/2021   ALT 15 06/08/2021   ANIONGAP 9 08/17/2016     Lab Results  Component Value Date   TSH 1.73 06/08/2021   Last vitamin D Lab Results  Component Value Date   VD25OH 30.78 06/08/2021    IMPRESSION AND PLAN:  #1 hypertension, well-controlled on bisoprolol 5 mg daily.  Refilled today.  Check basic metabolic panel today.  2.  Vitamin D deficiency.  It is hard to tell if she is taking vitamin D regularly since last  level check a year ago.  We will recheck this today.  Of note, her gynecologist recommended that she start taking vitamin D to try to help decrease tendency to get urine infections. Insulin resistance is something she is at risk for and this condition could lead to problems with UTIs. Checking glucose--nonfasting--today.  Consider checking insulin level and A1c in the future.  An After Visit Summary was printed and given to the patient.  FOLLOW UP: Return in about 6 months (around 11/19/2022) for routine chronic illness f/u.  Signed:  Santiago BumpersPhil Lory Nowaczyk, MD           05/20/2022

## 2022-05-23 ENCOUNTER — Encounter: Payer: Self-pay | Admitting: Family Medicine

## 2022-06-23 ENCOUNTER — Ambulatory Visit: Payer: BC Managed Care – PPO | Admitting: Physician Assistant

## 2022-06-23 ENCOUNTER — Ambulatory Visit: Payer: BC Managed Care – PPO | Admitting: Gastroenterology

## 2022-08-08 ENCOUNTER — Ambulatory Visit: Payer: BC Managed Care – PPO | Admitting: Physician Assistant

## 2022-08-08 ENCOUNTER — Encounter: Payer: Self-pay | Admitting: Physician Assistant

## 2022-08-08 VITALS — BP 130/80 | HR 80 | Ht 61.25 in | Wt 213.0 lb

## 2022-08-08 DIAGNOSIS — Z800 Family history of malignant neoplasm of digestive organs: Secondary | ICD-10-CM | POA: Diagnosis not present

## 2022-08-08 DIAGNOSIS — K219 Gastro-esophageal reflux disease without esophagitis: Secondary | ICD-10-CM

## 2022-08-08 DIAGNOSIS — K589 Irritable bowel syndrome without diarrhea: Secondary | ICD-10-CM | POA: Diagnosis not present

## 2022-08-08 MED ORDER — OMEPRAZOLE 20 MG PO CPDR: 20.0000 mg | DELAYED_RELEASE_CAPSULE | Freq: Two times a day (BID) | ORAL | 5 refills | Status: DC

## 2022-08-08 MED ORDER — NA SULFATE-K SULFATE-MG SULF 17.5-3.13-1.6 GM/177ML PO SOLN: 1.0000 | Freq: Once | ORAL | 0 refills | Status: AC

## 2022-08-08 NOTE — Progress Notes (Signed)
Chief Complaint: IBS and GERD  HPI:    Leslie Beck is a 37 year old female, known to Dr. Rhea Belton, with a past medical history as listed below including IBS and a family history of colon cancer, who was referred to me by Jeoffrey Massed, MD for a complaint of IBS and GERD.      01/12/2016 office visit with Dr. Rhea Belton to discuss alternating diarrhea and constipation with abdominal pain. It was discussed these are most consistent with irritable bowel. Recommended she avoid PPI if possible during pregnancy. Started on ranitidine 150 twice daily. That time benefiting from the addition of a probiotic yogurt. Also recommended starting fiber. Celiac panel, CBC and CMP as well as ultrasound were ordered. Was recommended she start screening for colon cancer at the age of 55.     02/21/2019 patient seen in clinic by me and described low back pain that radiated to her right lower quadrant also being "out of rhythm" with her BMs.  She was anxious given her family history of colon cancer.  Also experiencing some burning in her stomach.  At that time increase Omeprazole to 40 mg daily and recommend she started a probiotic and fiber supplement.  Discussed that if her symptoms did not get better could consider colonoscopy.    Today, patient tells me that she has continued with her chronic GI issues radiating back-and-forth from diarrhea to constipation which is sometimes unpredictable.  Has found that if she eats raw fiber in her diet this seems to help the most as opposed to a fiber supplement.  Apparently saw her PCP about a year ago because she was having reflux symptoms worse at night who started her on Pepcid 40 mg nightly in addition to her Omeprazole 20 mg in the morning.  This seemed to help but then about 6 months ago she started having  reoccurring yeast infections and was taken off of the Pepcid.  Then about 3 months ago she had recurrent UTIs and was put on a bunch of antibiotics which seem to flare all of  her symptoms.  For a period of time she was on Prilosec 20 twice a day and Pepcid 40 mg at night which did seem to calm everything down.  In fact things are back to her almost normal but she is just worried as this was a change in her typical symptoms.  Currently on Omeprazole 20 mg in the morning and Famotidine 40 mg at night if needed.    Also discussed his bowel movements which still seems to radiate back-and-forth, she either feels bloated or has loose stool.  Tells me she now has issues with hemorrhoids since having her babies and reminds me of the colon cancer history in her family.    Denies fever, chills, weight loss or blood in her stool.  Past Medical History:  Diagnosis Date   ALLERGIC RHINITIS    Asthma    Family history of colon cancer    2 second degree relatives: needs to start screening colonoscopies at age 31 per GI.   GERD (gastroesophageal reflux disease)    not on daily PPI anymore   Hypertension    IBS (irritable bowel syndrome) 2017   alt constip/diarrhea   Obesity, Class II, BMI 35-39.9    Psoriasis    vs dyshidrotic dermatitis per latest eval by Dr. Terri Piedra 08/15/13   Recurrent UTI 2012/13   Vitamin D deficiency     Past Surgical History:  Procedure Laterality Date  NO PAST SURGERIES      Current Outpatient Medications  Medication Sig Dispense Refill   acetaminophen (TYLENOL) 500 MG tablet Take 1,000 mg by mouth every 6 (six) hours as needed for headache.     bisoprolol (ZEBETA) 5 MG tablet Take 1 tablet (5 mg total) by mouth daily. 90 tablet 1   clobetasol ointment (TEMOVATE) 0.05 % Apply 1 application topically at bedtime as needed (psoriasis). 30 g 1   famotidine (PEPCID) 40 MG tablet Take 1 tablet (40 mg total) by mouth daily. (Patient taking differently: Take 20 mg by mouth daily.) 90 tablet 0   fluticasone (FLONASE) 50 MCG/ACT nasal spray fluticasone propionate 50 mcg/actuation nasal spray,suspension     Na Sulfate-K Sulfate-Mg Sulf 17.5-3.13-1.6 GM/177ML  SOLN Take 1 kit by mouth once for 1 dose. 354 mL 0   omeprazole (PRILOSEC) 20 MG capsule Take 1 capsule (20 mg total) by mouth 2 (two) times daily before a meal. 60 capsule 5   Probiotic Product (PROBIOTIC PO) Take by mouth daily.     Simethicone (GAS-X PO) Take 1 tablet by mouth daily as needed (digestion).     No current facility-administered medications for this visit.    Allergies as of 08/08/2022 - Review Complete 08/08/2022  Allergen Reaction Noted   Latex Rash    Zithromax [azithromycin] Rash 05/26/2010    Family History  Problem Relation Age of Onset   Hypertension Mother    Hypothyroidism Mother    Other Mother        varicose veins   Hypertension Father    Colon cancer Maternal Grandfather    Colon cancer Paternal Grandmother     Social History   Socioeconomic History   Marital status: Married    Spouse name: Not on file   Number of children: 2   Years of education: Not on file   Highest education level: Not on file  Occupational History   Not on file  Tobacco Use   Smoking status: Never   Smokeless tobacco: Never  Vaping Use   Vaping Use: Never used  Substance and Sexual Activity   Alcohol use: No   Drug use: No   Sexual activity: Yes    Partners: Male    Birth control/protection: None  Other Topics Concern   Not on file  Social History Narrative   Married, two children.   Orig from [place], went to [employer].   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   No T/A/Ds.   Sees Dr. Tenny Craw for GYN.   Social Determinants of Health   Financial Resource Strain: Not on file  Food Insecurity: Not on file  Transportation Needs: Not on file  Physical Activity: Not on file  Stress: Not on file  Social Connections: Not on file  Intimate Partner Violence: Not on file    Review of Systems:    Constitutional: No weight loss, fever or chills Skin: No rash Cardiovascular: No chest pain   Respiratory: No SOB  Gastrointestinal: See HPI and otherwise  negative Genitourinary: No dysuria  Neurological: No headache, dizziness or syncope Musculoskeletal: No new muscle or joint pain Hematologic: No bleeding  Psychiatric: No history of depression or anxiety   Physical Exam:  Vital signs: BP 130/80 (BP Location: Left Arm, Patient Position: Sitting)   Pulse 80   Ht 5' 1.25" (1.556 m)   Wt 213 lb (96.6 kg)   SpO2 97%   BMI 39.92 kg/m   Constitutional:   Pleasant obese Caucasian female appears  to be in NAD, Well developed, Well nourished, alert and cooperative Head:  Normocephalic and atraumatic. Eyes:   PEERL, EOMI. No icterus. Conjunctiva pink. Ears:  Normal auditory acuity. Neck:  Supple Throat: Oral cavity and pharynx without inflammation, swelling or lesion.  Respiratory: Respirations even and unlabored. Lungs clear to auscultation bilaterally.   No wheezes, crackles, or rhonchi.  Cardiovascular: Normal S1, S2. No MRG. Regular rate and rhythm. No peripheral edema, cyanosis or pallor.  Gastrointestinal:  Soft, nondistended, nontender. No rebound or guarding. Normal bowel sounds. No appreciable masses or hepatomegaly. Rectal:  Not performed.  Msk:  Symmetrical without gross deformities. Without edema, no deformity or joint abnormality.  Neurologic:  Alert and  oriented x4;  grossly normal neurologically.  Skin:   Dry and intact without significant lesions or rashes. Psychiatric: Demonstrates good judgement and reason without abnormal affect or behaviors.  RELEVANT LABS AND IMAGING: CBC    Component Value Date/Time   WBC 10.6 (H) 06/08/2021 1037   RBC 4.66 06/08/2021 1037   HGB 14.3 06/08/2021 1037   HCT 42.4 06/08/2021 1037   PLT 272.0 06/08/2021 1037   MCV 91.0 06/08/2021 1037   MCH 31.3 08/18/2016 0519   MCHC 33.7 06/08/2021 1037   RDW 12.5 06/08/2021 1037   LYMPHSABS 2.9 01/12/2016 1449   MONOABS 0.7 01/12/2016 1449   EOSABS 0.1 01/12/2016 1449   BASOSABS 0.1 01/12/2016 1449    CMP     Component Value Date/Time    NA 141 05/20/2022 1341   K 3.9 05/20/2022 1341   CL 103 05/20/2022 1341   CO2 29 05/20/2022 1341   GLUCOSE 85 05/20/2022 1341   BUN 11 05/20/2022 1341   CREATININE 0.80 05/20/2022 1341   CALCIUM 9.2 05/20/2022 1341   PROT 7.0 06/08/2021 1037   ALBUMIN 4.4 06/08/2021 1037   AST 15 06/08/2021 1037   ALT 15 06/08/2021 1037   ALKPHOS 76 06/08/2021 1037   BILITOT 1.3 (H) 06/08/2021 1037   GFRNONAA >60 08/17/2016 0552   GFRAA >60 08/17/2016 0552    Assessment: 1.  Change in bowel habits: Still radiating back-and-forth from diarrhea to constipation, previously diagnosed with IBS but this all seems more extreme lately; likely IBS 2.  GERD: Increase symptoms recently after antibiotic usage; likely reactive gastritis 3.  Family history of colon cancer: In 2 second-degree relatives  Plan: 1.  Given that the symptoms have continued over the past 6 years or so and patient is worried we will go ahead and schedule her for EGD and colonoscopy.  These were scheduled with Dr. Rhea Belton in the [facility].  Did provide the patient a detailed list of risks for the procedures and she agrees to proceed. Patient is appropriate for endoscopic procedure(s) in the ambulatory ([facility]) setting.  2.  Encouraged the patient to continue her Omeprazole, would increase to 20 mg twice daily, 30-60 minutes before breakfast and dinner.  Prescribed #60 with 5 refills.  Discussed staying on this for the next 2 to 3 months and then backing to once a day if she can. 3.  Can continue Pepcid 40 mg at night. 4.  Briefly discussed that her weight could be contributing to symptoms.  Tells me she is having a hard time with this.  Encouraged high-protein and fiber. 5.  Patient to follow in clinic per recommendations from Dr. Rhea Belton after time of procedures.  Hyacinth Meeker, PA-C [facility] 08/08/2022, 3:31 PM  Cc: McGowen, Maryjean Morn, MD

## 2022-08-08 NOTE — Patient Instructions (Signed)
We have sent the following medications to your pharmacy for you to pick up at your convenience: Omeprazole 20 mg twice daily 30-60 minutes before breakfast and dinner.   You have been scheduled for an endoscopy and colonoscopy. Please follow the written instructions given to you at your visit today. Please pick up your prep supplies at the pharmacy within the next 1-3 days. If you use inhalers (even only as needed), please bring them with you on the day of your procedure.  _______________________________________________________  If your blood pressure at your visit was 140/90 or greater, please contact your primary care physician to follow up on this.  _______________________________________________________  If you are age 37 or older, your body mass index should be between 23-30. Your Body mass index is 39.92 kg/m. If this is out of the aforementioned range listed, please consider follow up with your Primary Care Provider.  If you are age 20 or younger, your body mass index should be between 19-25. Your Body mass index is 39.92 kg/m. If this is out of the aformentioned range listed, please consider follow up with your Primary Care Provider.   ________________________________________________________  The [facility] providers would like to encourage you to use [facility] to communicate with providers for non-urgent requests or questions.  Due to long hold times on the telephone, sending your provider a message by [facility] may be a faster and more efficient way to get a response.  Please allow 48 business hours for a response.  Please remember that this is for non-urgent requests.  _______________________________________________________

## 2022-08-11 NOTE — Progress Notes (Signed)
Addendum: Reviewed and agree with assessment and management plan. Jamicah Anstead M, MD  

## 2022-10-20 ENCOUNTER — Encounter: Payer: BC Managed Care – PPO | Admitting: Internal Medicine

## 2022-11-21 ENCOUNTER — Ambulatory Visit: Payer: BC Managed Care – PPO | Admitting: Family Medicine

## 2022-11-21 VITALS — BP 138/82 | HR 80 | Wt 213.4 lb

## 2022-11-21 DIAGNOSIS — E559 Vitamin D deficiency, unspecified: Secondary | ICD-10-CM | POA: Diagnosis not present

## 2022-11-21 DIAGNOSIS — I10 Essential (primary) hypertension: Secondary | ICD-10-CM

## 2022-11-21 DIAGNOSIS — Z23 Encounter for immunization: Secondary | ICD-10-CM | POA: Diagnosis not present

## 2022-11-21 DIAGNOSIS — K219 Gastro-esophageal reflux disease without esophagitis: Secondary | ICD-10-CM | POA: Diagnosis not present

## 2022-11-21 LAB — VITAMIN D 25 HYDROXY (VIT D DEFICIENCY, FRACTURES): VITD: 21.57 ng/mL — ABNORMAL LOW (ref 30.00–100.00)

## 2022-11-21 MED ORDER — BISOPROLOL FUMARATE 5 MG PO TABS: 5.0000 mg | ORAL_TABLET | Freq: Every day | ORAL | 1 refills | Status: DC

## 2022-11-21 MED ORDER — OMEPRAZOLE 20 MG PO CPDR: DELAYED_RELEASE_CAPSULE | ORAL | 1 refills | Status: DC

## 2022-11-21 NOTE — Progress Notes (Signed)
OFFICE VISIT  11/21/2022  CC:  Chief Complaint  Patient presents with   Medical Management of Chronic Issues    Patient is a 37 y.o. female who presents for 4-month follow-up hypertension. A/P as of last visit: "1 hypertension, well-controlled on bisoprolol 5 mg daily.  Refilled today.  Check basic metabolic panel today.   2.  Vitamin D deficiency.  It is hard to tell if she is taking vitamin D regularly since last level check a year ago.  We will recheck this today.  Of note, her gynecologist recommended that she start taking vitamin D to try to help decrease tendency to get urine infections. Insulin resistance is something she is at risk for and this condition could lead to problems with UTIs. Checking glucose--nonfasting--today.  Consider checking insulin level and A1c in the future."  INTERIM HX: Leslie Beck is feeling good. Home blood pressures 120s over 70s.  Heart rate around 80 average.  She has chronic IBS symptoms, has seen GI and they are going to do EGD and colonoscopy pretty soon. She takes Prilosec 20 mg a day and has Pepcid to take twice daily as needed.  She has been taking 2000 units of vitamin D daily.  She saw urogynecologist for recurrent UTI.  When she saw the provider she was not having any symptoms.  She was told to start a daily probiotic to help the vaginal flora. She was referred to a nutritionist to try to work on a low inflammation diet.  ROS as above, plus--> no fevers, no CP, no SOB, no wheezing, no cough, no dizziness, no HAs, no rashes, no melena/hematochezia.  No polyuria or polydipsia.  No myalgias or arthralgias.  No focal weakness, paresthesias, or tremors.  No acute vision or hearing abnormalities.  No dysuria or unusual/new urinary urgency or frequency.  No recent changes in lower legs. No nausea, vomiting, or abdominal pain.  No palpitations.      Past Medical History:  Diagnosis Date   ALLERGIC RHINITIS    Asthma    Family history of colon  cancer    2 second degree relatives: needs to start screening colonoscopies at age 60 per GI.   GERD (gastroesophageal reflux disease)    not on daily PPI anymore   Hypertension    IBS (irritable bowel syndrome) 2017   alt constip/diarrhea   Obesity, Class II, BMI 35-39.9    Psoriasis    vs dyshidrotic dermatitis per latest eval by Dr. Terri Piedra 08/15/13   Recurrent UTI 2012/13   Vitamin D deficiency     Past Surgical History:  Procedure Laterality Date   NO PAST SURGERIES      Outpatient Medications Prior to Visit  Medication Sig Dispense Refill   acetaminophen (TYLENOL) 500 MG tablet Take 1,000 mg by mouth every 6 (six) hours as needed for headache.     clobetasol ointment (TEMOVATE) 0.05 % Apply 1 application topically at bedtime as needed (psoriasis). 30 g 1   famotidine (PEPCID) 40 MG tablet Take 1 tablet (40 mg total) by mouth daily. (Patient taking differently: Take 20 mg by mouth daily.) 90 tablet 0   fluticasone (FLONASE) 50 MCG/ACT nasal spray fluticasone propionate 50 mcg/actuation nasal spray,suspension     Probiotic Product (PROBIOTIC PO) Take by mouth daily.     Simethicone (GAS-X PO) Take 1 tablet by mouth daily as needed (digestion).     omeprazole (PRILOSEC) 20 MG capsule Take 1 capsule (20 mg total) by mouth 2 (two) times daily before  a meal. 60 capsule 5   bisoprolol (ZEBETA) 5 MG tablet Take 1 tablet (5 mg total) by mouth daily. 90 tablet 1   No facility-administered medications prior to visit.    Allergies  Allergen Reactions   Latex Rash   Zithromax [Azithromycin] Rash    Review of Systems As per HPI  PE:    11/21/2022    1:08 PM 08/08/2022    2:48 PM 05/20/2022    1:20 PM  Vitals with BMI  Height  5' 1.25"   Weight 213 lbs 6 oz 213 lbs 210 lbs 6 oz  BMI  39.91   Systolic 138 130 086  Diastolic 82 80 81  Pulse 80 80 76     Physical Exam  Gen: Alert, well appearing.  Patient is oriented to person, place, time, and situation. AFFECT: pleasant,  lucid thought and speech. No further exam today.  LABS:  Last CBC Lab Results  Component Value Date   WBC 10.6 (H) 06/08/2021   HGB 14.3 06/08/2021   HCT 42.4 06/08/2021   MCV 91.0 06/08/2021   MCH 31.3 08/18/2016   RDW 12.5 06/08/2021   PLT 272.0 06/08/2021   Last metabolic panel Lab Results  Component Value Date   GLUCOSE 85 05/20/2022   NA 141 05/20/2022   K 3.9 05/20/2022   CL 103 05/20/2022   CO2 29 05/20/2022   BUN 11 05/20/2022   CREATININE 0.80 05/20/2022   GFR 94.85 05/20/2022   CALCIUM 9.2 05/20/2022   PROT 7.0 06/08/2021   ALBUMIN 4.4 06/08/2021   BILITOT 1.3 (H) 06/08/2021   ALKPHOS 76 06/08/2021   AST 15 06/08/2021   ALT 15 06/08/2021   ANIONGAP 9 08/17/2016   Last lipids Lab Results  Component Value Date   CHOL 189 07/28/2011   HDL 75.20 07/28/2011   LDLCALC 94 07/28/2011   TRIG 97.0 07/28/2011   CHOLHDL 3 07/28/2011   Last thyroid functions Lab Results  Component Value Date   TSH 1.73 06/08/2021   Last vitamin D Lab Results  Component Value Date   VD25OH 20.86 (L) 05/20/2022   IMPRESSION AND PLAN:  #1 hypertension, well-controlled on bisoprolol 5 mg a day.  2.  Vitamin D deficiency. Continue 2000 units vitamin D daily. Recheck vitamin D level today.  3.  GERD, improved on omeprazole 20 mg a day and twice daily as needed use of Pepcid. Refilled omeprazole today.  #4 recurrent UTI. She is established with urogynecology. Takes daily probiotic and will be starting new low-inflammation diet when she sees the nutritionist soon.  An After Visit Summary was printed and given to the patient.  FOLLOW UP: Return in about 6 months (around 05/22/2023) for annual CPE (fasting).  Signed:  Santiago Bumpers, MD           11/21/2022

## 2022-11-22 ENCOUNTER — Encounter: Payer: Self-pay | Admitting: Internal Medicine

## 2022-11-24 ENCOUNTER — Ambulatory Visit (AMBULATORY_SURGERY_CENTER): Payer: BC Managed Care – PPO

## 2022-11-24 VITALS — Ht 61.0 in | Wt 213.0 lb

## 2022-11-24 DIAGNOSIS — Z800 Family history of malignant neoplasm of digestive organs: Secondary | ICD-10-CM

## 2022-11-24 DIAGNOSIS — K589 Irritable bowel syndrome without diarrhea: Secondary | ICD-10-CM

## 2022-11-24 DIAGNOSIS — K219 Gastro-esophageal reflux disease without esophagitis: Secondary | ICD-10-CM

## 2022-11-24 NOTE — Progress Notes (Signed)
No egg or soy allergy known to patient  No issues known to pt with past sedation with any surgeries or procedures Patient denies ever being told they had issues or difficulty with intubation  No FH of Malignant Hyperthermia Pt is not on diet pills Pt is not on  home 02  Pt is not on blood thinners  Pt  has constipation takes Benefiber & probiotic No A fib or A flutter Have any cardiac testing pending--no Pt can ambulate independently Pt denies use of chewing tobacco Discussed diabetic I weight loss medication holds Discussed NSAID holds Checked BMI Pt instructed to use [employer] or GoodRx for a price reduction on prep  Patient's chart reviewed by Cathlyn Parsons CNRA prior to previsit and patient appropriate for the [facility].  Pre visit completed and red dot placed by patient's name on their procedure day (on provider's schedule).

## 2022-11-25 ENCOUNTER — Encounter: Payer: Self-pay | Admitting: Internal Medicine

## 2022-12-05 ENCOUNTER — Encounter: Payer: Self-pay | Admitting: Internal Medicine

## 2022-12-05 ENCOUNTER — Ambulatory Visit: Payer: BC Managed Care – PPO | Admitting: Internal Medicine

## 2022-12-05 VITALS — BP 110/68 | HR 94 | Temp 98.0°F | Resp 11 | Ht 61.0 in | Wt 213.0 lb

## 2022-12-05 DIAGNOSIS — K219 Gastro-esophageal reflux disease without esophagitis: Secondary | ICD-10-CM

## 2022-12-05 DIAGNOSIS — K589 Irritable bowel syndrome without diarrhea: Secondary | ICD-10-CM

## 2022-12-05 DIAGNOSIS — Z800 Family history of malignant neoplasm of digestive organs: Secondary | ICD-10-CM

## 2022-12-05 DIAGNOSIS — R194 Change in bowel habit: Secondary | ICD-10-CM | POA: Diagnosis not present

## 2022-12-05 DIAGNOSIS — K319 Disease of stomach and duodenum, unspecified: Secondary | ICD-10-CM | POA: Diagnosis not present

## 2022-12-05 MED ORDER — SODIUM CHLORIDE 0.9 % IV SOLN: 500.0000 mL | Freq: Once | INTRAVENOUS | Status: DC

## 2022-12-05 NOTE — Progress Notes (Signed)
Sedate, gd SR, tolerated procedure well, VSS, report to RN 

## 2022-12-05 NOTE — Op Note (Signed)
[facility] Patient Name: Omani Abdulaziz Procedure Date: 12/05/2022 2:15 PM MRN: [id number] Endoscopist: Beverley Fiedler , MD, [id number] Age: 37 Referring MD:  Date of Birth: Jul 21, 1985 Gender: Female Account #: [id number] Procedure:                Upper GI endoscopy Indications:              Gastro-esophageal reflux disease (improvement with                            Nexium 20 mg BID, but recently back to once daily                            dosing), alternating bowel habits with diarrhea                            predominance Medicines:                Monitored Anesthesia Care Procedure:                Pre-Anesthesia Assessment:                           - Prior to the procedure, a History and Physical                            was performed, and patient medications and                            allergies were reviewed. The patient's tolerance of                            previous anesthesia was also reviewed. The risks                            and benefits of the procedure and the sedation                            options and risks were discussed with the patient.                            All questions were answered, and informed consent                            was obtained. Prior Anticoagulants: The patient has                            taken no anticoagulant or antiplatelet agents. ASA                            Grade Assessment: I - A normal, healthy patient.                            After reviewing the risks and benefits, the patient  was deemed in satisfactory condition to undergo the                            procedure.                           After obtaining informed consent, the endoscope was                            passed under direct vision. Throughout the                            procedure, the patient's blood pressure, pulse, and                            oxygen saturations were monitored continuously. The                             GIF [id number] #[id number] was introduced through the                            mouth, and advanced to the second part of duodenum.                            The upper GI endoscopy was accomplished without                            difficulty. The patient tolerated the procedure                            well. Scope In: Scope Out: Findings:                 The examined esophagus was normal.                           The entire examined stomach was normal. Biopsies                            were taken with a cold forceps for Helicobacter                            pylori testing.                           The examined duodenum was normal. Biopsies for                            histology were taken with a cold forceps for                            evaluation of celiac disease. Complications:            No immediate complications. Estimated Blood Loss:     Estimated blood loss was minimal. Impression:               -  Normal esophagus.                           - Normal stomach. Biopsied.                           - Normal examined duodenum. Biopsied. Recommendation:           - Patient has a contact number available for                            emergencies. The signs and symptoms of potential                            delayed complications were discussed with the                            patient. Return to normal activities tomorrow.                            Written discharge instructions were provided to the                            patient.                           - Resume previous diet.                           - Continue present medications.                           - Await pathology results.                           - See the other procedure note for documentation of                            additional recommendations. Beverley Fiedler, MD 12/05/2022 2:45:31 PM This report has been signed electronically.

## 2022-12-05 NOTE — Progress Notes (Signed)
Called to room to assist during endoscopic procedure.  Patient ID and intended procedure confirmed with present staff. Received instructions for my participation in the procedure from the performing physician.  

## 2022-12-05 NOTE — Progress Notes (Signed)
VS by DT  Pt's states no medical or surgical changes since previsit or office visit.  

## 2022-12-05 NOTE — Op Note (Signed)
[facility] Patient Name: Leslie Beck Procedure Date: 12/05/2022 2:15 PM MRN: [id number] Endoscopist: Beverley Fiedler , MD, [id number] Age: 37 Referring MD:  Date of Birth: 11/02/1985 Gender: Female Account #: [id number] Procedure:                Colonoscopy Indications:              Change in bowel habits, alternating bowel habits                            with diarrhea predominance Medicines:                Monitored Anesthesia Care Procedure:                Pre-Anesthesia Assessment:                           - Prior to the procedure, a History and Physical                            was performed, and patient medications and                            allergies were reviewed. The patient's tolerance of                            previous anesthesia was also reviewed. The risks                            and benefits of the procedure and the sedation                            options and risks were discussed with the patient.                            All questions were answered, and informed consent                            was obtained. Prior Anticoagulants: The patient has                            taken no anticoagulant or antiplatelet agents. ASA                            Grade Assessment: I - A normal, healthy patient.                            After reviewing the risks and benefits, the patient                            was deemed in satisfactory condition to undergo the                            procedure.  After obtaining informed consent, the colonoscope                            was passed under direct vision. Throughout the                            procedure, the patient's blood pressure, pulse, and                            oxygen saturations were monitored continuously. The                            CF [id number] #[id number] was introduced through the anus                            and advanced to the terminal ileum. The  colonoscopy                            was performed without difficulty. The patient                            tolerated the procedure well. The quality of the                            bowel preparation was excellent. The terminal                            ileum, ileocecal valve, appendiceal orifice, and                            rectum were photographed. Scope In: 2:31:25 PM Scope Out: 2:39:16 PM Scope Withdrawal Time: 0 hours 6 minutes 28 seconds  Total Procedure Duration: 0 hours 7 minutes 51 seconds  Findings:                 The digital rectal exam was normal.                           The terminal ileum appeared normal.                           The entire examined colon appeared normal on direct                            and retroflexion views.                           Biopsies for histology were taken with a cold                            forceps from the right colon and left colon for                            evaluation of microscopic colitis. Complications:  No immediate complications. Estimated Blood Loss:     Estimated blood loss: none. Impression:               - The examined portion of the ileum was normal.                           - The entire examined colon is normal on direct and                            retroflexion views.                           - Biopsies were taken with a cold forceps from the                            right colon and left colon for evaluation of                            microscopic colitis. Recommendation:           - Patient has a contact number available for                            emergencies. The signs and symptoms of potential                            delayed complications were discussed with the                            patient. Return to normal activities tomorrow.                            Written discharge instructions were provided to the                            patient.                           -  Resume previous diet.                           - Continue present medications.                           - Await pathology results.                           - If biopsies negative I would recommend course of                            rifaximin 550 mg TID x 14 days for IBS-D.                           - Repeat colonoscopy in 10 years for screening  purposes. Beverley Fiedler, MD 12/05/2022 2:48:25 PM This report has been signed electronically.

## 2022-12-05 NOTE — Patient Instructions (Signed)
Thank you for letting us take care of your healthcare needs today.      YOU HAD AN ENDOSCOPIC PROCEDURE TODAY AT THE [facility]:   Refer to the procedure report that was given to you for any specific questions about what was found during the examination.  If the procedure report does not answer your questions, please call your gastroenterologist to clarify.  If you requested that your care partner not be given the details of your procedure findings, then the procedure report has been included in a sealed envelope for you to review at your convenience later.  YOU SHOULD EXPECT: Some feelings of bloating in the abdomen. Passage of more gas than usual.  Walking can help get rid of the air that was put into your GI tract during the procedure and reduce the bloating. If you had a lower endoscopy (such as a colonoscopy or flexible sigmoidoscopy) you may notice spotting of blood in your stool or on the toilet paper. If you underwent a bowel prep for your procedure, you may not have a normal bowel movement for a few days.  Please Note:  You might notice some irritation and congestion in your nose or some drainage.  This is from the oxygen used during your procedure.  There is no need for concern and it should clear up in a day or so.  SYMPTOMS TO REPORT IMMEDIATELY:  Following lower endoscopy (colonoscopy or flexible sigmoidoscopy):  Excessive amounts of blood in the stool  Significant tenderness or worsening of abdominal pains  Swelling of the abdomen that is new, acute  Fever of 100F or higher  Following upper endoscopy (EGD)  Vomiting of blood or coffee ground material  New chest pain or pain under the shoulder blades  Painful or persistently difficult swallowing  New shortness of breath  Fever of 100F or higher  Black, tarry-looking stools  For urgent or emergent issues, a gastroenterologist can be reached at any hour by calling [phone]. Do not use MyChart messaging for  urgent concerns.    DIET:  We do recommend a small meal at first, but then you may proceed to your regular diet.  Drink plenty of fluids but you should avoid alcoholic beverages for 24 hours.  ACTIVITY:  You should plan to take it easy for the rest of today and you should NOT DRIVE or use heavy machinery until tomorrow (because of the sedation medicines used during the test).    FOLLOW UP: Our staff will call the number listed on your records the next business day following your procedure.  We will call around 7:15- 8:00 am to check on you and address any questions or concerns that you may have regarding the information given to you following your procedure. If we do not reach you, we will leave a message.     If any biopsies were taken you will be contacted by phone or by letter within the next 1-3 weeks.  Please call us at [phone] if you have not heard about the biopsies in 3 weeks.    SIGNATURES/CONFIDENTIALITY: You and/or your care partner have signed paperwork which will be entered into your electronic medical record.  These signatures attest to the fact that that the information above on your After Visit Summary has been reviewed and is understood.  Full responsibility of the confidentiality of this discharge information lies with you and/or your care-partner.

## 2022-12-05 NOTE — Progress Notes (Signed)
GASTROENTEROLOGY PROCEDURE H&P NOTE   Primary Care Physician: Jeoffrey Massed, MD    Reason for Procedure:  Alternating bowel habits, GERD, family history of colon cancer in 2 second-degree relatives  Plan:    EGD and colonoscopy  Patient is appropriate for endoscopic procedure(s) in the ambulatory ([facility]) setting.  The nature of the procedure, as well as the risks, benefits, and alternatives were carefully and thoroughly reviewed with the patient. Ample time for discussion and questions allowed. The patient understood, was satisfied, and agreed to proceed.     HPI: Leslie Beck is a 37 y.o. female who presents for EGD and colonoscopy.  Medical history as below.  Tolerated the prep.  No recent chest pain or shortness of breath.  No abdominal pain today.  Past Medical History:  Diagnosis Date   ALLERGIC RHINITIS    Asthma    Family history of colon cancer    2 second degree relatives: needs to start screening colonoscopies at age 42 per GI.   GERD (gastroesophageal reflux disease)    not on daily PPI anymore   Hypertension    IBS (irritable bowel syndrome) 2017   alt constip/diarrhea   Obesity, Class II, BMI 35-39.9    Psoriasis    vs dyshidrotic dermatitis per latest eval by Dr. Terri Piedra 08/15/13   Recurrent UTI 2012/13   Vitamin D deficiency     Past Surgical History:  Procedure Laterality Date   NO PAST SURGERIES      Prior to Admission medications   Medication Sig Start Date End Date Taking? Authorizing Provider  bisoprolol (ZEBETA) 5 MG tablet Take 1 tablet (5 mg total) by mouth daily. 11/21/22  Yes McGowen, Maryjean Morn, MD  clobetasol ointment (TEMOVATE) 0.05 % Apply 1 application topically at bedtime as needed (psoriasis). 04/01/15  Yes Kuneff, Renee A, DO  omeprazole (PRILOSEC) 20 MG capsule 1 tab qd 11/21/22  Yes McGowen, Maryjean Morn, MD  acetaminophen (TYLENOL) 500 MG tablet Take 1,000 mg by mouth every 6 (six) hours as needed for headache.    [provider]  famotidine (PEPCID) 40 MG tablet Take 1 tablet (40 mg total) by mouth daily. Patient taking differently: Take 20 mg by mouth daily. 09/09/20   Kuneff, Renee A, DO  fluticasone (FLONASE) 50 MCG/ACT nasal spray fluticasone propionate 50 mcg/actuation nasal spray,suspension Patient not taking: Reported on 11/24/2022    [provider]  nystatin-triamcinolone ointment [employer]) SMARTSIG:Sparingly Topical Twice Daily Patient not taking: Reported on 12/05/2022 09/18/22   [provider]  Probiotic Product (PROBIOTIC PO) Take by mouth daily. Patient not taking: Reported on 11/24/2022    [provider]  Simethicone (GAS-X PO) Take 1 tablet by mouth daily as needed (digestion).    [provider]    Current Outpatient Medications  Medication Sig Dispense Refill   bisoprolol (ZEBETA) 5 MG tablet Take 1 tablet (5 mg total) by mouth daily. 90 tablet 1   clobetasol ointment (TEMOVATE) 0.05 % Apply 1 application topically at bedtime as needed (psoriasis). 30 g 1   omeprazole (PRILOSEC) 20 MG capsule 1 tab qd 90 capsule 1   acetaminophen (TYLENOL) 500 MG tablet Take 1,000 mg by mouth every 6 (six) hours as needed for headache.     famotidine (PEPCID) 40 MG tablet Take 1 tablet (40 mg total) by mouth daily. (Patient taking differently: Take 20 mg by mouth daily.) 90 tablet 0   fluticasone (FLONASE) 50 MCG/ACT nasal spray fluticasone propionate 50 mcg/actuation nasal spray,suspension (  Patient not taking: Reported on 11/24/2022)     nystatin-triamcinolone ointment (MYCOLOG) SMARTSIG:Sparingly Topical Twice Daily (Patient not taking: Reported on 12/05/2022)     Probiotic Product (PROBIOTIC PO) Take by mouth daily. (Patient not taking: Reported on 11/24/2022)     Simethicone (GAS-X PO) Take 1 tablet by mouth daily as needed (digestion).     Current Facility-Administered Medications  Medication Dose Route Frequency Provider Last Rate Last Admin   0.9 %  sodium  chloride infusion  500 mL Intravenous Once Mikie Misner, Carie Caddy, MD        Allergies as of 12/05/2022 - Review Complete 12/05/2022  Allergen Reaction Noted   Latex Rash    Zithromax [azithromycin] Rash 05/26/2010    Family History  Problem Relation Age of Onset   Hypertension Mother    Hypothyroidism Mother    Other Mother        varicose veins   Hypertension Father    Colon cancer Maternal Grandfather    Colon cancer Paternal Grandmother    Colon polyps Neg Hx    Esophageal cancer Neg Hx    Rectal cancer Neg Hx    Stomach cancer Neg Hx     Social History   Socioeconomic History   Marital status: Married    Spouse name: Not on file   Number of children: 2   Years of education: Not on file   Highest education level: Bachelor's degree (e.g., BA, AB, BS)  Occupational History   Not on file  Tobacco Use   Smoking status: Never   Smokeless tobacco: Never  Vaping Use   Vaping status: Never Used  Substance and Sexual Activity   Alcohol use: No   Drug use: No   Sexual activity: Yes    Partners: Male    Birth control/protection: None  Other Topics Concern   Not on file  Social History Narrative   Married, two children.   Orig from [place], went to [employer].   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   No T/A/Ds.   Sees Dr. Tenny Craw for GYN.   Social Determinants of Health   Financial Resource Strain: Low Risk  (11/21/2022)   Overall Financial Resource Strain (CARDIA)    Difficulty of Paying Living Expenses: Not hard at all  Food Insecurity: No Food Insecurity (11/21/2022)   Hunger Vital Sign    Worried About Running Out of Food in the Last Year: Never true    Ran Out of Food in the Last Year: Never true  Transportation Needs: No Transportation Needs (11/21/2022)   PRAPARE - Administrator, Civil Service (Medical): No    Lack of Transportation (Non-Medical): No  Physical Activity: Insufficiently Active (11/21/2022)   Exercise Vital Sign    Days of Exercise per Week:  3 days    Minutes of Exercise per Session: 30 min  Stress: No Stress Concern Present (11/21/2022)   [employer] of Occupational Health - Occupational Stress Questionnaire    Feeling of Stress : Not at all  Social Connections: Socially Integrated (11/21/2022)   Social Connection and Isolation Panel [NHANES]    Frequency of Communication with Friends and Family: More than three times a week    Frequency of Social Gatherings with Friends and Family: More than three times a week    Attends Religious Services: More than 4 times per year    Active Member of [employer] or Organizations: Yes    Attends Banker Meetings: Patient declined  Marital Status: Married  Catering manager Violence: Not on file    Physical Exam: Vital signs in last 24 hours: @BP  124/75   Pulse 80   Temp 98 F (36.7 C)   Ht 5\' 1"  (1.549 m)   Wt 213 lb (96.6 kg)   LMP 11/24/2022   SpO2 100%   BMI 40.25 kg/m  GEN: NAD EYE: Sclerae anicteric ENT: MMM CV: Non-tachycardic Pulm: CTA b/l GI: Soft, NT/ND NEURO:  Alert & Oriented x 3   Erick Blinks, MD [facility]  12/05/2022 2:09 PM

## 2022-12-06 ENCOUNTER — Telehealth: Payer: Self-pay

## 2022-12-06 NOTE — Telephone Encounter (Signed)
  Follow up Call-     12/05/2022    1:42 PM  Call back number  Post procedure Call Back phone  # [phone]  Permission to leave phone message Yes     Patient questions:  Do you have a fever, pain , or abdominal swelling? No. Pain Score  0 *  Have you tolerated food without any problems? Yes.    Have you been able to return to your normal activities? Yes.    Do you have any questions about your discharge instructions: Diet   No. Medications  No. Follow up visit  No.  Do you have questions or concerns about your Care? No.  Actions: * If pain score is 4 or above: No action needed, pain <4.

## 2022-12-08 LAB — SURGICAL PATHOLOGY

## 2022-12-09 ENCOUNTER — Other Ambulatory Visit: Payer: Self-pay

## 2022-12-09 MED ORDER — RIFAXIMIN 550 MG PO TABS
550.0000 mg | ORAL_TABLET | Freq: Three times a day (TID) | ORAL | 0 refills | Status: AC
Start: 2022-12-09 — End: 2022-12-23

## 2022-12-14 ENCOUNTER — Other Ambulatory Visit: Payer: Self-pay

## 2023-05-17 ENCOUNTER — Other Ambulatory Visit: Payer: Self-pay | Admitting: Family Medicine

## 2023-06-15 ENCOUNTER — Other Ambulatory Visit: Payer: Self-pay | Admitting: Family Medicine

## 2023-06-20 ENCOUNTER — Encounter: Payer: Self-pay | Admitting: Family Medicine

## 2023-06-20 NOTE — Telephone Encounter (Signed)
 No further action needed at this time.

## 2023-06-20 NOTE — Telephone Encounter (Signed)
 Flonase and Allegra are very good and are okay to take with your blood pressure medication.

## 2023-07-03 ENCOUNTER — Ambulatory Visit (INDEPENDENT_AMBULATORY_CARE_PROVIDER_SITE_OTHER): Payer: Self-pay | Admitting: Family Medicine

## 2023-07-03 ENCOUNTER — Encounter: Payer: Self-pay | Admitting: Family Medicine

## 2023-07-03 VITALS — BP 132/70 | Temp 98.3°F | Ht 62.5 in | Wt 216.2 lb

## 2023-07-03 DIAGNOSIS — I10 Essential (primary) hypertension: Secondary | ICD-10-CM | POA: Diagnosis not present

## 2023-07-03 DIAGNOSIS — Z1159 Encounter for screening for other viral diseases: Secondary | ICD-10-CM

## 2023-07-03 DIAGNOSIS — E559 Vitamin D deficiency, unspecified: Secondary | ICD-10-CM | POA: Diagnosis not present

## 2023-07-03 DIAGNOSIS — K2950 Unspecified chronic gastritis without bleeding: Secondary | ICD-10-CM

## 2023-07-03 DIAGNOSIS — R1013 Epigastric pain: Secondary | ICD-10-CM

## 2023-07-03 DIAGNOSIS — Z0000 Encounter for general adult medical examination without abnormal findings: Secondary | ICD-10-CM

## 2023-07-03 MED ORDER — FAMOTIDINE 20 MG PO TABS: 20.0000 mg | ORAL_TABLET | Freq: Two times a day (BID) | ORAL | 3 refills | Status: AC

## 2023-07-03 MED ORDER — BISOPROLOL FUMARATE 5 MG PO TABS: 5.0000 mg | ORAL_TABLET | Freq: Every day | ORAL | 3 refills | Status: AC

## 2023-07-03 NOTE — Progress Notes (Signed)
 Office Note 07/03/2023  CC:  Chief Complaint  Patient presents with   Annual Exam    Pt is fasting   Patient is a 38 y.o. female who is here for annual health maintenance exam and 53-month follow-up hypertension and vitamin D  deficiency. A/P as of last visit: "1 hypertension, well-controlled on bisoprolol  5 mg a day.   2.  Vitamin D  deficiency. Continue 2000 units vitamin D  daily. Recheck vitamin D  level today.   3.  GERD, improved on omeprazole  20 mg a day and twice daily as needed use of Pepcid . Refilled omeprazole  today.   #4 recurrent UTI. She is established with urogynecology. Takes daily probiotic and will be starting new low-inflammation diet when she sees the nutritionist soon."  INTERIM HX: She is feeling well. She does still have some IBS symptoms.  She finds that carbohydrates actually help her symptoms calm down.  However this is causing some problems with inability to lose weight. She will be getting the help of a nutritionist soon.  Omeprazole  and/or famotidine  have helped some in the past for her general gastrointestinal upset. She currently takes omeprazole  20 mg twice a day and does not feel like it is made significant improvement over and above what famotidine  did for her in the past.  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 She walks several days a week for exercise. Home blood pressures are consistently 120/70 or so.  Past Medical History:  Diagnosis Date   ALLERGIC RHINITIS    Asthma    Family history of colon cancer    2 second degree relatives: needs to start screening colonoscopies at age 27 per GI.   GERD (gastroesophageal reflux disease)    not on daily PPI anymore   Hypertension    IBS (irritable bowel syndrome) 2017   alt constip/diarrhea   Obesity, Class II, BMI 35-39.9    Psoriasis    vs dyshidrotic dermatitis per latest eval by Dr. Fleurette Humbles 08/15/13   Recurrent UTI 2012/13   Vitamin D  deficiency      Past Surgical History:  Procedure Laterality Date   COLONOSCOPY     12/05/2022 normal   ESOPHAGOGASTRODUODENOSCOPY     12/05/22 mild reactive gastropathy, bx's neg    Family History  Problem Relation Age of Onset   Hypertension Mother    Hypothyroidism Mother    Other Mother        varicose veins   Hypertension Father    Colon cancer Maternal Grandfather    Colon cancer Paternal Grandmother    Colon polyps Neg Hx    Esophageal cancer Neg Hx    Rectal cancer Neg Hx    Stomach cancer Neg Hx     Social History   Socioeconomic History   Marital status: Married    Spouse name: Not on file   Number of children: 2   Years of education: Not on file   Highest education level: Bachelor's degree (e.g., BA, AB, BS)  Occupational History   Not on file  Tobacco Use   Smoking status: Never   Smokeless tobacco: Never  Vaping Use   Vaping status: Never Used  Substance and Sexual Activity   Alcohol use: No   Drug use: No   Sexual activity: Yes    Partners: Male    Birth control/protection: None  Other Topics Concern   Not on file  Social History Narrative   Married, two children.   Orig from [place], went to [employer].  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   No T/A/Ds.   Sees Dr. Avanell Bob for GYN.   Social Drivers of Corporate investment banker Strain: Low Risk  (07/03/2023)   Overall Financial Resource Strain (CARDIA)    Difficulty of Paying Living Expenses: Not hard at all  Food Insecurity: No Food Insecurity (07/03/2023)   Hunger Vital Sign    Worried About Running Out of Food in the Last Year: Never true    Ran Out of Food in the Last Year: Never true  Transportation Needs: No Transportation Needs (07/03/2023)   PRAPARE - Administrator, Civil Service (Medical): No    Lack of Transportation (Non-Medical): No  Physical Activity: Insufficiently Active (07/03/2023)   Exercise Vital Sign    Days of Exercise per Week: 3 days    Minutes of Exercise per  Session: 20 min  Stress: No Stress Concern Present (07/03/2023)   [employer] of Occupational Health - Occupational Stress Questionnaire    Feeling of Stress : Not at all  Social Connections: Socially Integrated (07/03/2023)   Social Connection and Isolation Panel [NHANES]    Frequency of Communication with Friends and Family: More than three times a week    Frequency of Social Gatherings with Friends and Family: More than three times a week    Attends Religious Services: More than 4 times per year    Active Member of [employer] or Organizations: Yes    Attends Banker Meetings: 1 to 4 times per year    Marital Status: Married  Catering manager Violence: Not on file    Outpatient Medications Prior to Visit  Medication Sig Dispense Refill   tacrolimus (PROTOPIC) 0.1 % ointment Apply topically 2 (two) times daily.     acetaminophen  (TYLENOL ) 500 MG tablet Take 1,000 mg by mouth every 6 (six) hours as needed for headache.     clobetasol  ointment (TEMOVATE ) 0.05 % Apply 1 application topically at bedtime as needed (psoriasis). 30 g 1   fluticasone (FLONASE) 50 MCG/ACT nasal spray fluticasone propionate 50 mcg/actuation nasal spray,suspension (Patient not taking: Reported on 11/24/2022)     nystatin-triamcinolone ointment (MYCOLOG) SMARTSIG:Sparingly Topical Twice Daily (Patient not taking: Reported on 12/05/2022)     Probiotic Product (PROBIOTIC PO) Take by mouth daily. (Patient not taking: Reported on 11/24/2022)     Simethicone  (GAS-X PO) Take 1 tablet by mouth daily as needed (digestion).     bisoprolol  (ZEBETA ) 5 MG tablet Take 1 tablet by mouth once daily 30 tablet 0   famotidine  (PEPCID ) 40 MG tablet Take 1 tablet (40 mg total) by mouth daily. (Patient taking differently: Take 20 mg by mouth daily.) 90 tablet 0   omeprazole  (PRILOSEC) 20 MG capsule 1 tab qd 90 capsule 1   No facility-administered medications prior to visit.    Allergies  Allergen Reactions   Latex Rash    Zithromax [Azithromycin] Rash    Review of Systems  Constitutional:  Negative for appetite change, chills, fatigue and fever.  HENT:  Negative for congestion, dental problem, ear pain and sore throat.   Eyes:  Negative for discharge, redness and visual disturbance.  Respiratory:  Negative for cough, chest tightness, shortness of breath and wheezing.   Cardiovascular:  Negative for chest pain, palpitations and leg swelling.  Gastrointestinal:  Negative for abdominal pain, blood in stool, diarrhea, nausea and vomiting.  Genitourinary:  Negative for difficulty urinating, dysuria, flank pain, frequency, hematuria and urgency.  Musculoskeletal:  Negative for arthralgias,  back pain, joint swelling, myalgias and neck stiffness.  Skin:  Negative for pallor and rash.  Neurological:  Negative for dizziness, speech difficulty, weakness and headaches.  Hematological:  Negative for adenopathy. Does not bruise/bleed easily.  Psychiatric/Behavioral:  Negative for confusion and sleep disturbance. The patient is not nervous/anxious.     PE;    07/03/2023    1:41 PM 12/05/2022    3:00 PM 12/05/2022    2:50 PM  Vitals with BMI  Height 5' 2.5"    Weight 216 lbs 3 oz    BMI 38.89    Systolic 132 110 161  Diastolic 70 68 56   Exam chaperoned by Cloe Motsinger, CMA Gen: Alert, well appearing.  Patient is oriented to person, place, time, and situation. AFFECT: pleasant, lucid thought and speech. ENT: Ears: EACs clear, normal epithelium.  TMs with good light reflex and landmarks bilaterally.  Eyes: no injection, icteris, swelling, or exudate.  EOMI, PERRLA. Nose: no drainage or turbinate edema/swelling.  No injection or focal lesion.  Mouth: lips without lesion/swelling.  Oral mucosa pink and moist.  Dentition intact and without obvious caries or gingival swelling.  Oropharynx without erythema, exudate, or swelling.  Neck: supple/nontender.  No LAD, mass, or TM.  Carotid pulses 2+ bilaterally, without  bruits. CV: RRR, no m/r/g.   LUNGS: CTA bilat, nonlabored resps, good aeration in all lung fields. ABD: soft, NT, ND, BS normal.  No hepatospenomegaly or mass.  No bruits. EXT: no clubbing, cyanosis, or edema.  Musculoskeletal: no joint swelling, erythema, warmth, or tenderness.  ROM of all joints intact. Skin - no sores or suspicious lesions or rashes or color changes  Pertinent labs:  Lab Results  Component Value Date   TSH 1.73 06/08/2021   Lab Results  Component Value Date   WBC 10.6 (H) 06/08/2021   HGB 14.3 06/08/2021   HCT 42.4 06/08/2021   MCV 91.0 06/08/2021   PLT 272.0 06/08/2021   Lab Results  Component Value Date   CREATININE 0.80 05/20/2022   BUN 11 05/20/2022   NA 141 05/20/2022   K 3.9 05/20/2022   CL 103 05/20/2022   CO2 29 05/20/2022   Lab Results  Component Value Date   ALT 15 06/08/2021   AST 15 06/08/2021   ALKPHOS 76 06/08/2021   BILITOT 1.3 (H) 06/08/2021   Lab Results  Component Value Date   CHOL 189 07/28/2011   Lab Results  Component Value Date   HDL 75.20 07/28/2011   Lab Results  Component Value Date   LDLCALC 94 07/28/2011   Lab Results  Component Value Date   TRIG 97.0 07/28/2011   Lab Results  Component Value Date   CHOLHDL 3 07/28/2011   ASSESSMENT AND PLAN:   #1 health maintenance exam: Reviewed age and gender appropriate health maintenance issues (prudent diet, regular exercise, health risks of tobacco and excessive alcohol, use of seatbelts, fire alarms in home, use of sunscreen).  Also reviewed age and gender appropriate health screening as well as vaccine recommendations. Vaccines: Tdap UTD. Labs: fasting HP+hep C screen, vit D (hx vit D def). Cervical ca screening: per GYN Breast ca screening:start annual mammograms at age 44. Colon ca screening: +FH Colon cancer.  Her colonoscopy 11/2022 was normal.    #2 GERD, dyspepsia, gastritis. She finds famotidine  a bit more helpful than omeprazole .  Additionally, she  feels like omeprazole  gives her flares of her eczema. We will switch to famotidine  20 mg twice a day.  She  looks forward to working with a nutritionist soon.  #3 hypertension, well-controlled on bisoprolol  5 mg a day. Monitor electrolytes and creatinine today.  An After Visit Summary was printed and given to the patient.  FOLLOW UP:  Return in about 6 months (around 01/03/2024) for routine chronic illness f/u.  Signed:  Arletha Lady, MD           07/03/2023

## 2023-07-04 ENCOUNTER — Ambulatory Visit: Payer: Self-pay | Admitting: Family Medicine

## 2023-07-04 LAB — CBC WITH DIFFERENTIAL/PLATELET
Absolute Lymphocytes: 3377 {cells}/uL (ref 850–3900)
Absolute Monocytes: 605 {cells}/uL (ref 200–950)
Basophils Absolute: 63 {cells}/uL (ref 0–200)
Basophils Relative: 0.5 %
Eosinophils Absolute: 101 {cells}/uL (ref 15–500)
Eosinophils Relative: 0.8 %
HCT: 44.1 % (ref 35.0–45.0)
Hemoglobin: 14.7 g/dL (ref 11.7–15.5)
MCH: 31 pg (ref 27.0–33.0)
MCHC: 33.3 g/dL (ref 32.0–36.0)
MCV: 93 fL (ref 80.0–100.0)
MPV: 11.2 fL (ref 7.5–12.5)
Monocytes Relative: 4.8 %
Neutro Abs: 8455 {cells}/uL — ABNORMAL HIGH (ref 1500–7800)
Neutrophils Relative %: 67.1 %
Platelets: 256 10*3/uL (ref 140–400)
RBC: 4.74 10*6/uL (ref 3.80–5.10)
RDW: 11.8 % (ref 11.0–15.0)
Total Lymphocyte: 26.8 %
WBC: 12.6 10*3/uL — ABNORMAL HIGH (ref 3.8–10.8)

## 2023-07-04 LAB — COMPREHENSIVE METABOLIC PANEL WITH GFR
AG Ratio: 1.8 (calc) (ref 1.0–2.5)
ALT: 18 U/L (ref 6–29)
AST: 18 U/L (ref 10–30)
Albumin: 4.5 g/dL (ref 3.6–5.1)
Alkaline phosphatase (APISO): 79 U/L (ref 31–125)
BUN: 13 mg/dL (ref 7–25)
CO2: 28 mmol/L (ref 20–32)
Calcium: 9.4 mg/dL (ref 8.6–10.2)
Chloride: 99 mmol/L (ref 98–110)
Creat: 0.8 mg/dL (ref 0.50–0.97)
Globulin: 2.5 g/dL (ref 1.9–3.7)
Glucose, Bld: 89 mg/dL (ref 65–99)
Potassium: 4 mmol/L (ref 3.5–5.3)
Sodium: 137 mmol/L (ref 135–146)
Total Bilirubin: 0.9 mg/dL (ref 0.2–1.2)
Total Protein: 7 g/dL (ref 6.1–8.1)
eGFR: 97 mL/min/{1.73_m2} (ref >=60)

## 2023-07-04 LAB — HEPATITIS C ANTIBODY: Hepatitis C Ab: NONREACTIVE

## 2023-07-04 LAB — TSH: TSH: 3.01 m[IU]/L

## 2023-07-04 LAB — LIPID PANEL
Cholesterol: 222 mg/dL — ABNORMAL HIGH (ref <200)
HDL: 74 mg/dL (ref >=50)
LDL Cholesterol (Calc): 118 mg/dL — ABNORMAL HIGH
Non-HDL Cholesterol (Calc): 148 mg/dL — ABNORMAL HIGH (ref <130)
Total CHOL/HDL Ratio: 3 (calc) (ref <5.0)
Triglycerides: 188 mg/dL — ABNORMAL HIGH (ref <150)

## 2023-07-04 LAB — VITAMIN D 25 HYDROXY (VIT D DEFICIENCY, FRACTURES): Vit D, 25-Hydroxy: 25 ng/mL — ABNORMAL LOW (ref 30–100)

## 2023-07-28 ENCOUNTER — Other Ambulatory Visit: Payer: Self-pay | Admitting: Physician Assistant

## 2024-01-03 ENCOUNTER — Ambulatory Visit: Admitting: Family Medicine

## 2024-01-04 ENCOUNTER — Ambulatory Visit: Payer: Self-pay

## 2024-01-04 NOTE — Telephone Encounter (Signed)
 FYI Only or Action Required?: FYI only for provider: appointment scheduled on 01/05/2024 at 11:20 AM.  Patient was last seen in primary care on 07/03/2023 by McGowen, Aleene DEL, MD.  Called Nurse Triage reporting Hoarse.  Symptoms began today.  Interventions attempted: Rest, hydration, or home remedies.  Symptoms are: unchanged.  Triage Disposition: See PCP When Office is Open (Within 3 Days)  Patient/caregiver understands and will follow disposition?: Yes  Copied from CRM #[id number]. Topic: Clinical - Red Word Triage >> Jan 04, 2024 10:01 AM Alfonso ORN wrote: Red Word that prompted transfer to Nurse Triage: pt friend said pt has possible laryngitis and coughing up green phlegm this morning Reason for Disposition  [1] MODERATE to SEVERE hoarseness (e.g., interferes with work) AND [2] professional voice user (e.g., lobbyist, singer, runner, broadcasting/film/video)  (Exception: Current common cold or mild URI symptoms.)  Answer Assessment - Initial Assessment Questions Patient calling with a friend speaking for her. Reports patient woke up with moderate to severe hoarseness today. Patient is only able to speak with a faint whisper. Endorses allergy symptoms earlier in the week. Reports sore throat currently. Scheduled to see PCP tomorrow at 11:20 AM  1. DESCRIPTION: Describe your voice. (e.g., coarse, raspy, weaker, airy, scratchy, deeper)     Faint whisper 2. SEVERITY: How bad is it?     Moderate to severe 3. ONSET: When did the hoarseness begin?     Started this morning 4. COUGH: Is there a cough? If Yes, ask: How bad is it?     Yes-mild  5. FEVER: Do you have a fever? If Yes, ask: What is your temperature, how was it measured, and when did it start?     no 6. ALLERGIES: Do you have any allergy symptoms? If Yes, ask: What are they? (e.g., nose stuffiness)     Had allergy symptoms earlier in the week but that got better.  7. IRRITANTS: Do you smoke? Have you been exposed to any  irritating fumes? (e.g., smoke)     no 8. CAUSE: What do you think is causing the hoarseness?     unsure 9. OTHER SYMPTOMS: Do you have any other symptoms? (e.g., breathing difficulty, fever, foreign body, lymph node swelling in neck, rash, sore throat, swallowing difficulty, weight loss)     Sore throat 10. PREGNANCY: Is there any chance you are pregnant? When was your last menstrual period?       no  Protocols used: [facility]

## 2024-01-04 NOTE — Telephone Encounter (Signed)
 noted

## 2024-01-05 ENCOUNTER — Encounter: Payer: Self-pay | Admitting: Family Medicine

## 2024-01-05 ENCOUNTER — Ambulatory Visit (INDEPENDENT_AMBULATORY_CARE_PROVIDER_SITE_OTHER): Admitting: Family Medicine

## 2024-01-05 VITALS — BP 122/80 | HR 104 | Temp 97.5°F | Ht 62.5 in | Wt 205.8 lb

## 2024-01-05 DIAGNOSIS — J040 Acute laryngitis: Secondary | ICD-10-CM

## 2024-01-05 DIAGNOSIS — J029 Acute pharyngitis, unspecified: Secondary | ICD-10-CM | POA: Diagnosis not present

## 2024-01-05 LAB — POCT RAPID STREP A (OFFICE): Rapid Strep A Screen: NEGATIVE

## 2024-01-05 NOTE — Progress Notes (Signed)
 OFFICE VISIT  01/05/2024  CC:  Chief Complaint  Patient presents with   Sore Throat    Pt also c/o hoarseness x     Patient is a 38 y.o. female who presents for hoarse voice and sore throat.  HPI: Onset 6 days ago, some runny nose and postnasal drip.  Felt like allergies initially.  Over the subsequent days she began developing a mild sore throat and a little bit of cough in the mornings.  Then a couple of days ago she lost her voice.  No headache, no face pain, no shortness of breath or wheezing.  Cough is still minimal and mainly just in the mornings. No fever or bodyaches.  Past Medical History:  Diagnosis Date   ALLERGIC RHINITIS    Asthma    Family history of colon cancer    2 second degree relatives: needs to start screening colonoscopies at age 87 per GI.   GERD (gastroesophageal reflux disease)    not on daily PPI anymore   Hypertension    IBS (irritable bowel syndrome) 2017   alt constip/diarrhea   Obesity, Class II, BMI 35-39.9    Psoriasis    vs dyshidrotic dermatitis per latest eval by Dr. Ivin 08/15/13   Recurrent UTI 2012/13   Vitamin D  deficiency     Past Surgical History:  Procedure Laterality Date   COLONOSCOPY     12/05/2022 normal   ESOPHAGOGASTRODUODENOSCOPY     12/05/22 mild reactive gastropathy, bx's neg    Outpatient Medications Prior to Visit  Medication Sig Dispense Refill   nystatin-triamcinolone ointment (MYCOLOG) SMARTSIG:Sparingly Topical Twice Daily (Patient taking differently: as needed.)     acetaminophen  (TYLENOL ) 500 MG tablet Take 1,000 mg by mouth every 6 (six) hours as needed for headache.     bisoprolol  (ZEBETA ) 5 MG tablet Take 1 tablet (5 mg total) by mouth daily. 90 tablet 3   clobetasol  ointment (TEMOVATE ) 0.05 % Apply 1 application topically at bedtime as needed (psoriasis). 30 g 1   famotidine  (PEPCID ) 20 MG tablet Take 1 tablet (20 mg total) by mouth 2 (two) times daily. 180 tablet 3   fluticasone (FLONASE) 50 MCG/ACT  nasal spray fluticasone propionate 50 mcg/actuation nasal spray,suspension (Patient not taking: Reported on 11/24/2022)     omeprazole  (PRILOSEC) 20 MG capsule TAKE 1 CAPSULE BY MOUTH TWICE DAILY BEFORE A MEAL 60 capsule 0   Simethicone  (GAS-X PO) Take 1 tablet by mouth daily as needed (digestion).     tacrolimus (PROTOPIC) 0.1 % ointment Apply topically 2 (two) times daily.     Probiotic Product (PROBIOTIC PO) Take by mouth daily. (Patient not taking: Reported on 11/24/2022)     No facility-administered medications prior to visit.    Allergies  Allergen Reactions   Latex Rash   Zithromax [Azithromycin] Rash    Review of Systems  As per HPI  PE:    01/05/2024   11:23 AM 01/05/2024   11:15 AM 07/03/2023    1:41 PM  Vitals with BMI  Height  5' 2.5 5' 2.5  Weight  205 lbs 13 oz 216 lbs 3 oz  BMI  37.02 38.89  Systolic 122 148 867  Diastolic 80 93 70  Pulse  104      Physical Exam  VS: noted--normal. Gen: alert, NAD, NONTOXIC APPEARING. HEENT: eyes without injection, drainage, or swelling.  Nose: No significant active rhinorrhea or injection or edema of nasal mucosa.  No purulent d/c.  No paranasal sinus TTP.  No  facial swelling.  Throat and mouth without focal lesion.  No pharyngial swelling or exudate.  She has some mild erythema of the soft palate.  No petechiae. Neck: supple, no LAD.   LUNGS: CTA bilat, nonlabored resps.   CV: RRR, no m/r/g. EXT: no c/c/e SKIN: no rash   LABS:  Last metabolic panel Lab Results  Component Value Date   GLUCOSE 89 07/03/2023   NA 137 07/03/2023   K 4.0 07/03/2023   CL 99 07/03/2023   CO2 28 07/03/2023   BUN 13 07/03/2023   CREATININE 0.80 07/03/2023   EGFR 97 07/03/2023   CALCIUM 9.4 07/03/2023   PROT 7.0 07/03/2023   ALBUMIN 4.4 06/08/2021   BILITOT 0.9 07/03/2023   ALKPHOS 76 06/08/2021   AST 18 07/03/2023   ALT 18 07/03/2023   ANIONGAP 9 08/17/2016   IMPRESSION AND PLAN:  Acute URI/laryngitis. Viral etiology  suspected. Voice rest recommended.  Sugarless hard candy recommended in order to induce swallowing and try to prevent clearing of the throat. Strep test negative today.  If her voice has not returned some in for 5 days then she will call and let us  know and I will do a trial of prednisone .  An After Visit Summary was printed and given to the patient.  FOLLOW UP: Return if symptoms worsen or fail to improve.  Signed:  Gerlene Hockey, MD           01/05/2024

## 2024-01-31 ENCOUNTER — Encounter: Payer: Self-pay | Admitting: Family Medicine

## 2024-01-31 ENCOUNTER — Ambulatory Visit (INDEPENDENT_AMBULATORY_CARE_PROVIDER_SITE_OTHER): Admitting: Family Medicine

## 2024-01-31 VITALS — BP 134/85 | HR 68 | Temp 97.5°F | Ht 62.5 in | Wt 209.4 lb

## 2024-01-31 DIAGNOSIS — Z23 Encounter for immunization: Secondary | ICD-10-CM | POA: Diagnosis not present

## 2024-01-31 DIAGNOSIS — I10 Essential (primary) hypertension: Secondary | ICD-10-CM | POA: Diagnosis not present

## 2024-01-31 DIAGNOSIS — E559 Vitamin D deficiency, unspecified: Secondary | ICD-10-CM

## 2024-01-31 DIAGNOSIS — E611 Iron deficiency: Secondary | ICD-10-CM

## 2024-01-31 DIAGNOSIS — K219 Gastro-esophageal reflux disease without esophagitis: Secondary | ICD-10-CM

## 2024-01-31 DIAGNOSIS — L409 Psoriasis, unspecified: Secondary | ICD-10-CM

## 2024-01-31 LAB — CBC
HCT: 41.7 % (ref 36.0–46.0)
Hemoglobin: 14.3 g/dL (ref 12.0–15.0)
MCHC: 34.3 g/dL (ref 30.0–36.0)
MCV: 91.2 fl (ref 78.0–100.0)
Platelets: 270 K/uL (ref 150.0–400.0)
RBC: 4.57 Mil/uL (ref 3.87–5.11)
RDW: 12.3 % (ref 11.5–15.5)
WBC: 8.6 K/uL (ref 4.0–10.5)

## 2024-01-31 MED ORDER — CLOBETASOL PROPIONATE 0.05 % EX OINT: 1.0000 | TOPICAL_OINTMENT | Freq: Every evening | CUTANEOUS | 1 refills | Status: AC | PRN

## 2024-01-31 NOTE — Progress Notes (Signed)
 OFFICE VISIT  01/31/2024  CC:  Chief Complaint  Patient presents with   Medical Management of Chronic Issues    Patient is a 38 y.o. female who presents for 31-month follow-up hypertension, GERD, and vitamin D  deficiency. A/P as of last visit: 1 GERD, dyspepsia, gastritis. She finds famotidine  a bit more helpful than omeprazole .  Additionally, she feels like omeprazole  gives her flares of her eczema. We will switch to famotidine  20 mg twice a day.  2 hypertension, well-controlled on bisoprolol  5 mg a day. Monitor electrolytes and creatinine today.  INTERIM HX: Vitamin D  was low last visit so I recommended she start taking 1000 units of vitamin D  daily. Overall Leslie Beck is doing well.  She has been having increased cramping and some lower legs pain with menses.  She does not have excessive menstrual bleeding. Her gynecologist recommended that we check CBC and iron today.  She does not take any iron supplement.  Home blood pressures consistently in the 130/80 range.  ROS as above, plus--> no fevers, no CP, no SOB, no wheezing, no cough, no dizziness, no HAs, no rashes, no melena/hematochezia.  No polyuria or polydipsia.  No myalgias or arthralgias.  No focal weakness, paresthesias, or tremors.  No acute vision or hearing abnormalities.  No dysuria or unusual/new urinary urgency or frequency.  No recent changes in lower legs. No n/v/d or abd pain.  No palpitations.    Past Medical History:  Diagnosis Date   ALLERGIC RHINITIS    Asthma    Family history of colon cancer    2 second degree relatives: needs to start screening colonoscopies at age 35 per GI.   GERD (gastroesophageal reflux disease)    not on daily PPI anymore   Hypertension    IBS (irritable bowel syndrome) 2017   alt constip/diarrhea   Obesity, Class II, BMI 35-39.9    Psoriasis    vs dyshidrotic dermatitis per latest eval by Dr. Ivin 08/15/13   Recurrent UTI 2012/13   Vitamin D  deficiency     Past Surgical  History:  Procedure Laterality Date   COLONOSCOPY     12/05/2022 normal   ESOPHAGOGASTRODUODENOSCOPY     12/05/22 mild reactive gastropathy, bx's neg    Outpatient Medications Prior to Visit  Medication Sig Dispense Refill   acetaminophen  (TYLENOL ) 500 MG tablet Take 1,000 mg by mouth every 6 (six) hours as needed for headache.     bisoprolol  (ZEBETA ) 5 MG tablet Take 1 tablet (5 mg total) by mouth daily. 90 tablet 3   famotidine  (PEPCID ) 20 MG tablet Take 1 tablet (20 mg total) by mouth 2 (two) times daily. 180 tablet 3   nystatin-triamcinolone ointment (MYCOLOG) SMARTSIG:Sparingly Topical Twice Daily (Patient taking differently: as needed.)     omeprazole  (PRILOSEC) 20 MG capsule TAKE 1 CAPSULE BY MOUTH TWICE DAILY BEFORE A MEAL 60 capsule 0   Simethicone  (GAS-X PO) Take 1 tablet by mouth daily as needed (digestion).     tacrolimus (PROTOPIC) 0.1 % ointment Apply topically 2 (two) times daily.     clobetasol  ointment (TEMOVATE ) 0.05 % Apply 1 application topically at bedtime as needed (psoriasis). 30 g 1   fluticasone (FLONASE) 50 MCG/ACT nasal spray fluticasone propionate 50 mcg/actuation nasal spray,suspension (Patient not taking: Reported on 01/31/2024)     No facility-administered medications prior to visit.    Allergies[1]  Review of Systems As per HPI  PE:    01/31/2024   10:45 AM 01/05/2024   11:23 AM 01/05/2024  11:15 AM  Vitals with BMI  Height 5' 2.5  5' 2.5  Weight 209 lbs 6 oz  205 lbs 13 oz  BMI 37.67  37.02  Systolic 134 122 851  Diastolic 85 80 93  Pulse 68  104     Physical Exam  Gen: Alert, well appearing.  Patient is oriented to person, place, time, and situation. AFFECT: pleasant, lucid thought and speech. No further exam today  LABS:  Last metabolic panel Lab Results  Component Value Date   GLUCOSE 89 07/03/2023   NA 137 07/03/2023   K 4.0 07/03/2023   CL 99 07/03/2023   CO2 28 07/03/2023   BUN 13 07/03/2023   CREATININE 0.80  07/03/2023   EGFR 97 07/03/2023   CALCIUM 9.4 07/03/2023   PROT 7.0 07/03/2023   ALBUMIN 4.4 06/08/2021   BILITOT 0.9 07/03/2023   ALKPHOS 76 06/08/2021   AST 18 07/03/2023   ALT 18 07/03/2023   ANIONGAP 9 08/17/2016   Lab Results  Component Value Date   WBC 12.6 (H) 07/03/2023   HGB 14.7 07/03/2023   HCT 44.1 07/03/2023   MCV 93.0 07/03/2023   PLT 256 07/03/2023   IMPRESSION AND PLAN:  #1 hypertension, well-controlled on bisoprolol  5 mg daily.  2.  Vitamin D  deficiency, tolerating 1000 unit vitamin D  over-the-counter tablet once a day. Recheck vitamin D  level today.  3.  Dietary iron insufficiency.  This is a result of her dietary modifications to avoid her IBS symptoms. She will try to increase green vegetables and periodically add red meat back to her diet.  Will check iron and CBC today.  4.  GERD, doing well on famotidine  20 mg 1-2 times a day.  Occasionally she will add a 1 to 2-week course of omeprazole  20 mg daily.  #5 psoriasis. She requested refill of her clobetasol  ointment to get her through to her next dermatology follow-up visit.  An After Visit Summary was printed and given to the patient.  FOLLOW UP: Return in about 6 months (around 07/31/2024) for annual CPE (fasting).  Signed:  Gerlene Hockey, MD           01/31/2024     [1]  Allergies Allergen Reactions   Latex Rash   Zithromax [Azithromycin] Rash

## 2024-01-31 NOTE — Patient Instructions (Signed)

## 2024-02-01 ENCOUNTER — Ambulatory Visit: Payer: Self-pay | Admitting: Family Medicine

## 2024-02-01 LAB — IRON,TIBC AND FERRITIN PANEL
%SAT: 26 % (ref 16–45)
Ferritin: 122 ng/mL (ref 16–154)
Iron: 87 ug/dL (ref 40–190)
TIBC: 336 ug/dL (ref 250–450)

## 2024-02-01 LAB — VITAMIN D 25 HYDROXY (VIT D DEFICIENCY, FRACTURES): Vit D, 25-Hydroxy: 27 ng/mL — ABNORMAL LOW (ref 30–100)

## 2024-02-01 NOTE — Progress Notes (Signed)
 Results sent to pt via mychart and routed to pts Gyn

## 2024-02-05 ENCOUNTER — Telehealth: Admitting: Family Medicine

## 2024-02-05 VITALS — Temp 100.6°F

## 2024-02-05 DIAGNOSIS — B9789 Other viral agents as the cause of diseases classified elsewhere: Secondary | ICD-10-CM | POA: Diagnosis not present

## 2024-02-05 DIAGNOSIS — J101 Influenza due to other identified influenza virus with other respiratory manifestations: Secondary | ICD-10-CM

## 2024-02-05 DIAGNOSIS — J988 Other specified respiratory disorders: Secondary | ICD-10-CM

## 2024-02-05 MED ORDER — OSELTAMIVIR PHOSPHATE 75 MG PO CAPS: 75.0000 mg | ORAL_CAPSULE | Freq: Two times a day (BID) | ORAL | 0 refills | Status: AC

## 2024-02-05 NOTE — Progress Notes (Signed)
 Virtual Visit via Video Note  I connected with Leslie Beck  on 02/05/2024 at  3:40 PM EST by a video enabled telemedicine application and verified that I am speaking with the correct person using two identifiers.  Location patient: [place] Location provider:work or home office Persons participating in the virtual visit: patient, provider  I discussed the limitations and requested verbal permission for telemedicine visit. The patient expressed understanding and agreed to proceed.   HPI: 38 year old female being seen today for cough. Onset 24 hours ago nasal congestion, sinus pressure, headache, temperature to 100.5. No sore throat or cough.  She is tired and has some generalized bodyaches. Flu test at home POSITIVE type A.  Her kids had similar illness last week. She did get her flu vaccine on 01/31/2024.  ROS: See pertinent positives and negatives per HPI.  Past Medical History:  Diagnosis Date   ALLERGIC RHINITIS    Asthma    Family history of colon cancer    2 second degree relatives: needs to start screening colonoscopies at age 36 per GI.   GERD (gastroesophageal reflux disease)    not on daily PPI anymore   Hypertension    IBS (irritable bowel syndrome) 2017   alt constip/diarrhea   Obesity, Class II, BMI 35-39.9    Psoriasis    vs dyshidrotic dermatitis per latest eval by Dr. Ivin 08/15/13   Recurrent UTI 2012/13   Vitamin D  deficiency     Past Surgical History:  Procedure Laterality Date   COLONOSCOPY     12/05/2022 normal   ESOPHAGOGASTRODUODENOSCOPY     12/05/22 mild reactive gastropathy, bx's neg    Current Medications[1]  EXAM:  VITALS per patient if applicable:     01/31/2024   10:45 AM 01/05/2024   11:23 AM 01/05/2024   11:15 AM  Vitals with BMI  Height 5' 2.5  5' 2.5  Weight 209 lbs 6 oz  205 lbs 13 oz  BMI 37.67  37.02  Systolic 134 122 851  Diastolic 85 80 93  Pulse 68  104     GENERAL: alert, oriented, appears well and in no acute  distress  HEENT: atraumatic, conjunttiva clear, no obvious abnormalities on inspection of external nose and ears  NECK: normal movements of the head and neck  LUNGS: on inspection no signs of respiratory distress, breathing rate appears normal, no obvious gross SOB, gasping or wheezing  CV: no obvious cyanosis  MS: moves all visible extremities without noticeable abnormality  PSYCH/NEURO: pleasant and cooperative, no obvious depression or anxiety, speech and thought processing grossly intact  LABS: none today except POS home flu test   Chemistry      Component Value Date/Time   NA 137 07/03/2023 1413   K 4.0 07/03/2023 1413   CL 99 07/03/2023 1413   CO2 28 07/03/2023 1413   BUN 13 07/03/2023 1413   CREATININE 0.80 07/03/2023 1413      Component Value Date/Time   CALCIUM 9.4 07/03/2023 1413   ALKPHOS 76 06/08/2021 1037   AST 18 07/03/2023 1413   ALT 18 07/03/2023 1413   BILITOT 0.9 07/03/2023 1413     Lab Results  Component Value Date   IRON 87 01/31/2024   TIBC 336 01/31/2024   FERRITIN 122 01/31/2024   ASSESSMENT AND PLAN:  Discussed the following assessment and plan:  Influenza A.  Primarily upper respiratory symptoms at this time but she is only 24 hours into her illness. Tamiflu  75 mg twice daily x 5 days  prescribed. Mucinex DM or Mucinex plain as needed.  Tylenol  1000 mg every 6 hours as needed. She can take 400 mg dose of ibuprofen  if still febrile 3 hours after her Tylenol  dose. She will try to limit the ibuprofen  to 2 times a day max. Saline nasal spray discussed.   I discussed the assessment and treatment plan with the patient. The patient was provided an opportunity to ask questions and all were answered. The patient agreed with the plan and demonstrated an understanding of the instructions.   F/u: If not significantly improving in 4 to 5 days  Signed:  Gerlene Hockey, MD           02/05/2024     [1]  Current Outpatient Medications:    acetaminophen   (TYLENOL ) 500 MG tablet, Take 1,000 mg by mouth every 6 (six) hours as needed for headache., Disp: , Rfl:    bisoprolol  (ZEBETA ) 5 MG tablet, Take 1 tablet (5 mg total) by mouth daily., Disp: 90 tablet, Rfl: 3   famotidine  (PEPCID ) 20 MG tablet, Take 1 tablet (20 mg total) by mouth 2 (two) times daily., Disp: 180 tablet, Rfl: 3   nystatin-triamcinolone ointment (MYCOLOG), SMARTSIG:Sparingly Topical Twice Daily (Patient taking differently: as needed.), Disp: , Rfl:    omeprazole  (PRILOSEC) 20 MG capsule, TAKE 1 CAPSULE BY MOUTH TWICE DAILY BEFORE A MEAL, Disp: 60 capsule, Rfl: 0   oseltamivir  (TAMIFLU ) 75 MG capsule, Take 1 capsule (75 mg total) by mouth 2 (two) times daily for 5 days., Disp: 10 capsule, Rfl: 0   Simethicone  (GAS-X PO), Take 1 tablet by mouth daily as needed (digestion)., Disp: , Rfl:    tacrolimus (PROTOPIC) 0.1 % ointment, Apply topically 2 (two) times daily., Disp: , Rfl:    clobetasol  ointment (TEMOVATE ) 0.05 %, Apply 1 Application topically at bedtime as needed (psoriasis)., Disp: 30 g, Rfl: 1

## 2024-08-06 ENCOUNTER — Encounter: Admitting: Family Medicine
